# Patient Record
Sex: Male | Born: 1935 | Race: White | Hispanic: No | Smoking: Former smoker
Health system: Southern US, Community
[De-identification: ages and names within clinical notes are randomized; demographics above are authoritative.]

## PROBLEM LIST (undated history)

## (undated) DIAGNOSIS — H9190 Unspecified hearing loss, unspecified ear: Secondary | ICD-10-CM

## (undated) DIAGNOSIS — M109 Gout, unspecified: Secondary | ICD-10-CM

## (undated) DIAGNOSIS — N200 Calculus of kidney: Secondary | ICD-10-CM

## (undated) DIAGNOSIS — I10 Essential (primary) hypertension: Secondary | ICD-10-CM

## (undated) HISTORY — DX: Unspecified hearing loss, unspecified ear: H91.90

## (undated) HISTORY — DX: Gout, unspecified: M10.9

## (undated) HISTORY — DX: Calculus of kidney: N20.0

## (undated) HISTORY — PX: CHOLECYSTECTOMY: SHX55

## (undated) HISTORY — DX: Essential (primary) hypertension: I10

## (undated) HISTORY — PX: HERNIA REPAIR: SHX51

---

## 2014-03-06 DIAGNOSIS — L0291 Cutaneous abscess, unspecified: Secondary | ICD-10-CM | POA: Diagnosis not present

## 2014-03-22 DIAGNOSIS — Z9842 Cataract extraction status, left eye: Secondary | ICD-10-CM | POA: Diagnosis not present

## 2014-03-22 DIAGNOSIS — H43813 Vitreous degeneration, bilateral: Secondary | ICD-10-CM | POA: Diagnosis not present

## 2014-05-30 DIAGNOSIS — J0100 Acute maxillary sinusitis, unspecified: Secondary | ICD-10-CM | POA: Diagnosis not present

## 2014-05-30 DIAGNOSIS — J209 Acute bronchitis, unspecified: Secondary | ICD-10-CM | POA: Diagnosis not present

## 2014-05-30 DIAGNOSIS — J309 Allergic rhinitis, unspecified: Secondary | ICD-10-CM | POA: Diagnosis not present

## 2014-10-01 DIAGNOSIS — Z23 Encounter for immunization: Secondary | ICD-10-CM | POA: Diagnosis not present

## 2016-07-15 DIAGNOSIS — R972 Elevated prostate specific antigen [PSA]: Secondary | ICD-10-CM | POA: Diagnosis not present

## 2016-07-15 DIAGNOSIS — N401 Enlarged prostate with lower urinary tract symptoms: Secondary | ICD-10-CM | POA: Diagnosis not present

## 2016-09-30 DIAGNOSIS — Z23 Encounter for immunization: Secondary | ICD-10-CM | POA: Diagnosis not present

## 2016-12-07 DIAGNOSIS — M25511 Pain in right shoulder: Secondary | ICD-10-CM | POA: Diagnosis not present

## 2016-12-30 DIAGNOSIS — J180 Bronchopneumonia, unspecified organism: Secondary | ICD-10-CM | POA: Diagnosis not present

## 2017-02-08 DIAGNOSIS — N401 Enlarged prostate with lower urinary tract symptoms: Secondary | ICD-10-CM | POA: Diagnosis not present

## 2017-02-08 DIAGNOSIS — R972 Elevated prostate specific antigen [PSA]: Secondary | ICD-10-CM | POA: Diagnosis not present

## 2017-08-06 DIAGNOSIS — M25511 Pain in right shoulder: Secondary | ICD-10-CM | POA: Diagnosis not present

## 2017-08-06 DIAGNOSIS — N3000 Acute cystitis without hematuria: Secondary | ICD-10-CM | POA: Diagnosis not present

## 2017-08-06 DIAGNOSIS — N3001 Acute cystitis with hematuria: Secondary | ICD-10-CM | POA: Diagnosis not present

## 2017-08-09 DIAGNOSIS — N3020 Other chronic cystitis without hematuria: Secondary | ICD-10-CM | POA: Diagnosis not present

## 2017-08-09 DIAGNOSIS — R972 Elevated prostate specific antigen [PSA]: Secondary | ICD-10-CM | POA: Diagnosis not present

## 2017-08-09 DIAGNOSIS — N401 Enlarged prostate with lower urinary tract symptoms: Secondary | ICD-10-CM | POA: Diagnosis not present

## 2017-08-10 DIAGNOSIS — B9689 Other specified bacterial agents as the cause of diseases classified elsewhere: Secondary | ICD-10-CM | POA: Diagnosis not present

## 2017-08-10 DIAGNOSIS — M109 Gout, unspecified: Secondary | ICD-10-CM | POA: Diagnosis not present

## 2017-08-10 DIAGNOSIS — N3000 Acute cystitis without hematuria: Secondary | ICD-10-CM | POA: Diagnosis not present

## 2017-08-10 DIAGNOSIS — M1990 Unspecified osteoarthritis, unspecified site: Secondary | ICD-10-CM | POA: Diagnosis not present

## 2017-08-10 DIAGNOSIS — R0602 Shortness of breath: Secondary | ICD-10-CM | POA: Diagnosis not present

## 2017-09-10 DIAGNOSIS — Z23 Encounter for immunization: Secondary | ICD-10-CM | POA: Diagnosis not present

## 2017-10-18 DIAGNOSIS — H52221 Regular astigmatism, right eye: Secondary | ICD-10-CM | POA: Diagnosis not present

## 2017-10-18 DIAGNOSIS — I10 Essential (primary) hypertension: Secondary | ICD-10-CM | POA: Diagnosis not present

## 2017-12-06 DIAGNOSIS — Z6823 Body mass index (BMI) 23.0-23.9, adult: Secondary | ICD-10-CM | POA: Diagnosis not present

## 2017-12-06 DIAGNOSIS — M19011 Primary osteoarthritis, right shoulder: Secondary | ICD-10-CM | POA: Diagnosis not present

## 2017-12-06 DIAGNOSIS — M109 Gout, unspecified: Secondary | ICD-10-CM | POA: Diagnosis not present

## 2017-12-06 DIAGNOSIS — I10 Essential (primary) hypertension: Secondary | ICD-10-CM | POA: Diagnosis not present

## 2018-02-07 DIAGNOSIS — N401 Enlarged prostate with lower urinary tract symptoms: Secondary | ICD-10-CM | POA: Diagnosis not present

## 2018-02-07 DIAGNOSIS — R972 Elevated prostate specific antigen [PSA]: Secondary | ICD-10-CM | POA: Diagnosis not present

## 2018-02-28 DIAGNOSIS — I10 Essential (primary) hypertension: Secondary | ICD-10-CM | POA: Diagnosis not present

## 2018-02-28 DIAGNOSIS — M1000 Idiopathic gout, unspecified site: Secondary | ICD-10-CM | POA: Diagnosis not present

## 2018-02-28 DIAGNOSIS — J018 Other acute sinusitis: Secondary | ICD-10-CM | POA: Diagnosis not present

## 2018-02-28 DIAGNOSIS — R7303 Prediabetes: Secondary | ICD-10-CM | POA: Diagnosis not present

## 2018-03-08 DIAGNOSIS — I10 Essential (primary) hypertension: Secondary | ICD-10-CM | POA: Diagnosis not present

## 2018-03-08 DIAGNOSIS — E782 Mixed hyperlipidemia: Secondary | ICD-10-CM | POA: Diagnosis not present

## 2018-05-05 DIAGNOSIS — M1000 Idiopathic gout, unspecified site: Secondary | ICD-10-CM | POA: Diagnosis not present

## 2018-05-05 DIAGNOSIS — I10 Essential (primary) hypertension: Secondary | ICD-10-CM | POA: Diagnosis not present

## 2018-05-05 DIAGNOSIS — Z1159 Encounter for screening for other viral diseases: Secondary | ICD-10-CM | POA: Diagnosis not present

## 2018-05-05 DIAGNOSIS — Z0000 Encounter for general adult medical examination without abnormal findings: Secondary | ICD-10-CM | POA: Diagnosis not present

## 2018-06-16 DIAGNOSIS — Z9841 Cataract extraction status, right eye: Secondary | ICD-10-CM | POA: Diagnosis not present

## 2018-06-16 DIAGNOSIS — Z9842 Cataract extraction status, left eye: Secondary | ICD-10-CM | POA: Diagnosis not present

## 2018-06-16 DIAGNOSIS — H353131 Nonexudative age-related macular degeneration, bilateral, early dry stage: Secondary | ICD-10-CM | POA: Diagnosis not present

## 2018-06-16 DIAGNOSIS — H524 Presbyopia: Secondary | ICD-10-CM | POA: Diagnosis not present

## 2018-06-16 DIAGNOSIS — Z961 Presence of intraocular lens: Secondary | ICD-10-CM | POA: Diagnosis not present

## 2018-08-29 DIAGNOSIS — Z23 Encounter for immunization: Secondary | ICD-10-CM | POA: Diagnosis not present

## 2018-08-30 DIAGNOSIS — N401 Enlarged prostate with lower urinary tract symptoms: Secondary | ICD-10-CM | POA: Diagnosis not present

## 2018-08-30 DIAGNOSIS — R972 Elevated prostate specific antigen [PSA]: Secondary | ICD-10-CM | POA: Diagnosis not present

## 2018-11-04 DIAGNOSIS — E782 Mixed hyperlipidemia: Secondary | ICD-10-CM | POA: Diagnosis not present

## 2018-11-04 DIAGNOSIS — I10 Essential (primary) hypertension: Secondary | ICD-10-CM | POA: Diagnosis not present

## 2018-11-04 DIAGNOSIS — M1000 Idiopathic gout, unspecified site: Secondary | ICD-10-CM | POA: Diagnosis not present

## 2018-11-07 DIAGNOSIS — R7301 Impaired fasting glucose: Secondary | ICD-10-CM | POA: Diagnosis not present

## 2018-11-28 DIAGNOSIS — R944 Abnormal results of kidney function studies: Secondary | ICD-10-CM | POA: Diagnosis not present

## 2019-02-20 ENCOUNTER — Other Ambulatory Visit: Payer: Self-pay | Admitting: Family Medicine

## 2019-04-13 ENCOUNTER — Other Ambulatory Visit: Payer: Self-pay | Admitting: Family Medicine

## 2019-04-13 ENCOUNTER — Other Ambulatory Visit: Payer: Self-pay

## 2019-04-13 MED ORDER — FINASTERIDE 5 MG PO TABS: 5.0000 mg | ORAL_TABLET | Freq: Every day | ORAL | 1 refills | Status: DC

## 2019-04-17 ENCOUNTER — Other Ambulatory Visit: Payer: Self-pay

## 2019-04-17 MED ORDER — FINASTERIDE 5 MG PO TABS: 5.0000 mg | ORAL_TABLET | Freq: Every day | ORAL | 1 refills | Status: DC

## 2019-04-21 ENCOUNTER — Other Ambulatory Visit: Payer: Self-pay | Admitting: Family Medicine

## 2019-04-21 MED ORDER — FINASTERIDE 5 MG PO TABS: 5.0000 mg | ORAL_TABLET | Freq: Every day | ORAL | 1 refills | Status: AC

## 2019-05-09 ENCOUNTER — Ambulatory Visit (INDEPENDENT_AMBULATORY_CARE_PROVIDER_SITE_OTHER): Payer: Medicare HMO

## 2019-05-09 ENCOUNTER — Other Ambulatory Visit: Payer: Self-pay

## 2019-05-09 VITALS — BP 120/74 | HR 60 | Temp 97.9°F | Ht 66.0 in | Wt 174.0 lb

## 2019-05-09 DIAGNOSIS — Z0000 Encounter for general adult medical examination without abnormal findings: Secondary | ICD-10-CM | POA: Diagnosis not present

## 2019-05-09 NOTE — Progress Notes (Signed)
Subjective:   Ryan Garrison is a 86 y.o. male who presents for Medicare Annual/Subsequent preventive examination.  This wellness visit is conducted by a nurse.  The patient's medications were reviewed and reconciled since the patient's last visit.  History details were provided by the patient.  The history appears to be reliable.    Patient's last AWV was one year ago.   Medical History: Patient history and Family history was reviewed  Medications, Allergies, and preventative health maintenance was reviewed and updated.   Review of Systems:  Review of Systems  Constitutional: Negative.   HENT: Positive for hearing loss.   Eyes: Negative.   Respiratory: Negative.  Negative for cough, chest tightness and shortness of breath.   Cardiovascular: Negative.  Negative for chest pain and palpitations.  Gastrointestinal: Negative.   Endocrine: Negative.   Genitourinary: Negative.   Musculoskeletal: Positive for arthralgias. Negative for joint swelling and myalgias.       Right Shoulder Pain  Neurological: Negative.  Negative for dizziness, numbness and headaches.  Psychiatric/Behavioral: Negative.  Negative for agitation, confusion and suicidal ideas. The patient is not nervous/anxious.    Cardiac Risk Factors include: advanced age (>51men, >72 women);hypertension;male gender     Objective:    Vitals: BP 120/74 (BP Location: Left Arm, Patient Position: Sitting, Cuff Size: Large)   Pulse 60   Temp 97.9 F (36.6 C) (Temporal)   Ht 5\' 6"  (1.676 m)   Wt 174 lb (78.9 kg)   SpO2 96%   BMI 28.08 kg/m   Body mass index is 28.08 kg/m.  Advanced Directives 05/09/2019  Does Patient Have a Medical Advance Directive? No  Would patient like information on creating a medical advance directive? Yes (MAU/Ambulatory/Procedural Areas - Information given)    Tobacco Social History   Tobacco Use  Smoking Status Former Smoker   Types: Cigarettes   Quit date: 1954   Years since quitting: 67.3   Smokeless Tobacco Never Used     Counseling given: Not Answered   Clinical Intake:  Pre-visit preparation completed: Yes  Pain : 0-10 Pain Score: 3  Pain Type: Acute pain Pain Location: Shoulder Pain Orientation: Right Pain Descriptors / Indicators: Aching Pain Frequency: Intermittent     BMI - recorded: 28 Nutritional Status: BMI 25 -29 Overweight Nutritional Risks: None Diabetes: No  How often do you need to have someone help you when you read instructions, pamphlets, or other written materials from your doctor or pharmacy?: 2 - Rarely  Interpreter Needed?: No     Past Medical History:  Diagnosis Date   Gout    Hearing loss    Hypertension    Renal stones     Family History  Problem Relation Age of Onset   Leukemia Father    Social History   Socioeconomic History   Marital status: Married    Spouse name: Gracie   Number of children: 3   Years of education: Not on file   Highest education level: Not on file  Occupational History   Occupation: Retired  Tobacco Use   Smoking status: Former Smoker    Types: Cigarettes    Quit date: 1954    Years since quitting: 67.3   Smokeless tobacco: Never Used  Substance and Sexual Activity   Alcohol use: Never   Drug use: Never   Sexual activity: Not on file  Other Topics Concern   Not on file  Social History Narrative   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   4  Brothers, 1 deceased   36 Sisters, 2 deceased   Social Determinants of Radio broadcast assistant Strain:    Difficulty of Paying Living Expenses:   Food Insecurity:    Worried About Charity fundraiser in the Last Year:    Arboriculturist in the Last Year:   Transportation Needs:    Film/video editor (Medical):    Lack of Transportation (Non-Medical):   Physical Activity:    Days of Exercise per Week:    Minutes of Exercise per Session:   Stress:    Feeling of Stress :   Social Connections:    Frequency of Communication with Friends and Family:     Frequency of Social Gatherings with Friends and Family:    Attends Religious Services:    Active Member of Clubs or Organizations:    Attends Archivist Meetings:    Marital Status:     Outpatient Encounter Medications as of 05/09/2019  Medication Sig   allopurinol (ZYLOPRIM) 300 MG tablet Take 300 mg by mouth daily.   aspirin 81 MG chewable tablet Chew 81 mg by mouth daily.   atenolol-chlorthalidone (TENORETIC) 50-25 MG tablet TAKE 1/2 TABLET EVERY DAY   finasteride (PROSCAR) 5 MG tablet Take 1 tablet (5 mg total) by mouth daily.   potassium chloride SA (KLOR-CON) 20 MEQ tablet TAKE 3 TABLETS DAILY ON MONDAYS, WEDNESDAYS, AND FRIDAYS - TAKE 2 TABLETS ON ALL OTHER DAYS.   No facility-administered encounter medications on file as of 05/09/2019.    Activities of Daily Living In your present state of health, do you have any difficulty performing the following activities: 05/09/2019  Hearing? Y  Vision? N  Difficulty concentrating or making decisions? N  Walking or climbing stairs? N  Dressing or bathing? N  Doing errands, shopping? N  Preparing Food and eating ? N  Using the Toilet? N  In the past six months, have you accidently leaked urine? N  Do you have problems with loss of bowel control? N  Managing your Medications? N  Managing your Finances? N  Housekeeping or managing your Housekeeping? N  Some recent data might be hidden    Patient Care Team: Rochel Brome, MD as PCP - General (Family Medicine)   Assessment:   This is a routine wellness examination for [place].  Exercise Activities and Dietary recommendations Current Exercise Habits: Home exercise routine, Type of exercise: walking, Time (Minutes): 30, Frequency (Times/Week): 3, Weekly Exercise (Minutes/Week): 90, Intensity: Mild  Goals      Advanced Care Planning complete by Next Appointment     Prevent falls        Fall Risk Fall Risk  05/09/2019  Falls in the past year? 1  Number falls in past yr: 0   Injury with Fall? 0  Risk for fall due to : No Fall Risks  Follow up Falls prevention discussed   Is the patient's home free of loose throw rugs in walkways, pet beds, electrical cords, etc?   yes      Grab bars in the bathroom? yes      Handrails on the stairs?   yes      Adequate lighting?   yes   Depression Screen PHQ 2/9 Scores 05/09/2019  PHQ - 2 Score 0    Cognitive Function     6CIT Screen 05/09/2019  What Year? 0 points  What month? 0 points  What time? 0 points  Count back from 20 0 points  Months in reverse 2 points  Repeat phrase 0 points  Total Score 2    Immunization History  Administered Date(s) Administered   Influenza-Unspecified 08/06/2018   Moderna SARS-COVID-2 Vaccination 03/01/2019, 03/22/2019   Pneumococcal Conjugate-13 02/08/2014   Pneumococcal Polysaccharide-23 02/06/2013   Tdap 01/06/2011     Screening Tests Health Maintenance  Topic Date Due   INFLUENZA VACCINE  08/06/2019   TETANUS/TDAP  01/05/2021   COVID-19 Vaccine  Completed   PNA vac Low Risk Adult  Completed   Cancer Screenings: Lung: Low Dose CT Chest recommended if Age 66-80 years, 30 pack-year currently smoking OR have quit w/in 15years. Patient does not qualify. Colorectal: Done in 2004 - Normal Results        Plan:    Counseling was provided today regarding the following topics: healthy eating habits, home safety, vitamin suppliments, regular exercise, tobacco avoidance, stress reduction, use of seat belts, firearm safety, and fall prevention.  Annual recommendations include: influenza vaccine, dental cleanings, and eye exams.  I am getting together resources for caregiver assistance to be mailed to him.  I have personally reviewed and noted the following in the patient's chart:   Medical and social history Use of alcohol, tobacco or illicit drugs  Current medications and supplements Functional ability and status Nutritional status Physical activity Advanced  directives List of other physicians Hospitalizations, surgeries, and ER visits in previous 12 months Vitals Screenings to include cognitive, depression, and falls Referrals and appointments  In addition, I have reviewed and discussed with patient certain preventive protocols, quality metrics, and best practice recommendations. A written personalized care plan for preventive services as well as general preventive health recommendations were provided to patient.     Erie Noe, LPN  [phone]

## 2019-05-09 NOTE — Patient Instructions (Signed)
 Fall Prevention in the Home, Adult Falls can cause injuries. They can happen to people of all ages. There are many things you can do to make your home safe and to help prevent falls. Ask for help when making these changes, if needed. What actions can I take to prevent falls? General Instructions  Use good lighting in all rooms. Replace any light bulbs that burn out.  Turn on the lights when you go into a dark area. Use night-lights.  Keep items that you use often in easy-to-reach places. Lower the shelves around your home if necessary.  Set up your furniture so you have a clear path. Avoid moving your furniture around.  Do not have throw rugs and other things on the floor that can make you trip.  Avoid walking on wet floors.  If any of your floors are uneven, fix them.  Add color or contrast paint or tape to clearly mark and help you see: ? Any grab bars or handrails. ? First and last steps of stairways. ? Where the edge of each step is.  If you use a stepladder: ? Make sure that it is fully opened. Do not climb a closed stepladder. ? Make sure that both sides of the stepladder are locked into place. ? Ask someone to hold the stepladder for you while you use it.  If there are any pets around you, be aware of where they are. What can I do in the bathroom?      Keep the floor dry. Clean up any water that spills onto the floor as soon as it happens.  Remove soap buildup in the tub or shower regularly.  Use non-skid mats or decals on the floor of the tub or shower.  Attach bath mats securely with double-sided, non-slip rug tape.  If you need to sit down in the shower, use a plastic, non-slip stool.  Install grab bars by the toilet and in the tub and shower. Do not use towel bars as grab bars. What can I do in the bedroom?  Make sure that you have a light by your bed that is easy to reach.  Do not use any sheets or blankets that are too big for your bed. They should  not hang down onto the floor.  Have a firm chair that has side arms. You can use this for support while you get dressed. What can I do in the kitchen?  Clean up any spills right away.  If you need to reach something above you, use a strong step stool that has a grab bar.  Keep electrical cords out of the way.  Do not use floor polish or wax that makes floors slippery. If you must use wax, use non-skid floor wax. What can I do with my stairs?  Do not leave any items on the stairs.  Make sure that you have a light switch at the top of the stairs and the bottom of the stairs. If you do not have them, ask someone to add them for you.  Make sure that there are handrails on both sides of the stairs, and use them. Fix handrails that are broken or loose. Make sure that handrails are as long as the stairways.  Install non-slip stair treads on all stairs in your home.  Avoid having throw rugs at the top or bottom of the stairs. If you do have throw rugs, attach them to the floor with carpet tape.  Choose a carpet that   does not hide the edge of the steps on the stairway.  Check any carpeting to make sure that it is firmly attached to the stairs. Fix any carpet that is loose or worn. What can I do on the outside of my home?  Use bright outdoor lighting.  Regularly fix the edges of walkways and driveways and fix any cracks.  Remove anything that might make you trip as you walk through a door, such as a raised step or threshold.  Trim any bushes or trees on the path to your home.  Regularly check to see if handrails are loose or broken. Make sure that both sides of any steps have handrails.  Install guardrails along the edges of any raised decks and porches.  Clear walking paths of anything that might make someone trip, such as tools or rocks.  Have any leaves, snow, or ice cleared regularly.  Use sand or salt on walking paths during winter.  Clean up any spills in your garage right  away. This includes grease or oil spills. What other actions can I take?  Wear shoes that: ? Have a low heel. Do not wear high heels. ? Have rubber bottoms. ? Are comfortable and fit you well. ? Are closed at the toe. Do not wear open-toe sandals.  Use tools that help you move around (mobility aids) if they are needed. These include: ? Canes. ? Walkers. ? Scooters. ? Crutches.  Review your medicines with your doctor. Some medicines can make you feel dizzy. This can increase your chance of falling. Ask your doctor what other things you can do to help prevent falls. Where to find more information  Centers for Disease Control and Prevention, STEADI: https://cdc.gov  National Institute on Aging: https://go4life.nia.nih.gov Contact a doctor if:  You are afraid of falling at home.  You feel weak, drowsy, or dizzy at home.  You fall at home. Summary  There are many simple things that you can do to make your home safe and to help prevent falls.  Ways to make your home safe include removing tripping hazards and installing grab bars in the bathroom.  Ask for help when making these changes in your home. This information is not intended to replace advice given to you by your health care provider. Make sure you discuss any questions you have with your health care provider. Document Revised: 04/14/2018 Document Reviewed: 08/06/2016 Elsevier Patient Education  2020 Elsevier Inc.   Health Maintenance, Male Adopting a healthy lifestyle and getting preventive care are important in promoting health and wellness. Ask your health care provider about:  The right schedule for you to have regular tests and exams.  Things you can do on your own to prevent diseases and keep yourself healthy. What should I know about diet, weight, and exercise? Eat a healthy diet   Eat a diet that includes plenty of vegetables, fruits, low-fat dairy products, and lean protein.  Do not eat a lot of foods  that are high in solid fats, added sugars, or sodium. Maintain a healthy weight Body mass index (BMI) is a measurement that can be used to identify possible weight problems. It estimates body fat based on height and weight. Your health care provider can help determine your BMI and help you achieve or maintain a healthy weight. Get regular exercise Get regular exercise. This is one of the most important things you can do for your health. Most adults should:  Exercise for at least 150 minutes each week. The   exercise should increase your heart rate and make you sweat (moderate-intensity exercise).  Do strengthening exercises at least twice a week. This is in addition to the moderate-intensity exercise.  Spend less time sitting. Even light physical activity can be beneficial. Watch cholesterol and blood lipids Have your blood tested for lipids and cholesterol at 84 years of age, then have this test every 5 years. You may need to have your cholesterol levels checked more often if:  Your lipid or cholesterol levels are high.  You are older than 84 years of age.  You are at high risk for heart disease. What should I know about cancer screening? Many types of cancers can be detected early and may often be prevented. Depending on your health history and family history, you may need to have cancer screening at various ages. This may include screening for:  Colorectal cancer.  Prostate cancer.  Skin cancer.  Lung cancer. What should I know about heart disease, diabetes, and high blood pressure? Blood pressure and heart disease  High blood pressure causes heart disease and increases the risk of stroke. This is more likely to develop in people who have high blood pressure readings, are of African descent, or are overweight.  Talk with your health care provider about your target blood pressure readings.  Have your blood pressure checked: ? Every 3-5 years if you are 18-39 years of  age. ? Every year if you are 40 years old or older.  If you are between the ages of 65 and 75 and are a current or former smoker, ask your health care provider if you should have a one-time screening for abdominal aortic aneurysm (AAA). Diabetes Have regular diabetes screenings. This checks your fasting blood sugar level. Have the screening done:  Once every three years after age 45 if you are at a normal weight and have a low risk for diabetes.  More often and at a younger age if you are overweight or have a high risk for diabetes. What should I know about preventing infection? Hepatitis B If you have a higher risk for hepatitis B, you should be screened for this virus. Talk with your health care provider to find out if you are at risk for hepatitis B infection. Hepatitis C Blood testing is recommended for:  Everyone born from 1945 through 1965.  Anyone with known risk factors for hepatitis C. Sexually transmitted infections (STIs)  You should be screened each year for STIs, including gonorrhea and chlamydia, if: ? You are sexually active and are younger than 84 years of age. ? You are older than 84 years of age and your health care provider tells you that you are at risk for this type of infection. ? Your sexual activity has changed since you were last screened, and you are at increased risk for chlamydia or gonorrhea. Ask your health care provider if you are at risk.  Ask your health care provider about whether you are at high risk for HIV. Your health care provider may recommend a prescription medicine to help prevent HIV infection. If you choose to take medicine to prevent HIV, you should first get tested for HIV. You should then be tested every 3 months for as long as you are taking the medicine. Follow these instructions at home: Lifestyle  Do not use any products that contain nicotine or tobacco, such as cigarettes, e-cigarettes, and chewing tobacco. If you need help quitting,  ask your health care provider.  Do not use street   drugs.  Do not share needles.  Ask your health care provider for help if you need support or information about quitting drugs. Alcohol use  Do not drink alcohol if your health care provider tells you not to drink.  If you drink alcohol: ? Limit how much you have to 0-2 drinks a day. ? Be aware of how much alcohol is in your drink. In the [place] one drink equals one 12 oz bottle of beer (355 mL), one 5 oz glass of wine (148 mL), or one 1 oz glass of hard liquor (44 mL). General instructions  Schedule regular health, dental, and eye exams.  Stay current with your vaccines.  Tell your health care provider if: ? You often feel depressed. ? You have ever been abused or do not feel safe at home. Summary  Adopting a healthy lifestyle and getting preventive care are important in promoting health and wellness.  Follow your health care provider's instructions about healthy diet, exercising, and getting tested or screened for diseases.  Follow your health care provider's instructions on monitoring your cholesterol and blood pressure. This information is not intended to replace advice given to you by your health care provider. Make sure you discuss any questions you have with your health care provider. Document Revised: 12/15/2017 Document Reviewed: 12/15/2017 Elsevier Patient Education  2020 Elsevier Inc.  

## 2019-05-12 DIAGNOSIS — N3090 Cystitis, unspecified without hematuria: Secondary | ICD-10-CM | POA: Diagnosis not present

## 2019-05-12 DIAGNOSIS — N3001 Acute cystitis with hematuria: Secondary | ICD-10-CM | POA: Diagnosis not present

## 2019-05-15 ENCOUNTER — Ambulatory Visit (INDEPENDENT_AMBULATORY_CARE_PROVIDER_SITE_OTHER): Payer: Medicare HMO | Admitting: Physician Assistant

## 2019-05-15 ENCOUNTER — Other Ambulatory Visit: Payer: Self-pay

## 2019-05-15 ENCOUNTER — Encounter: Payer: Self-pay | Admitting: Physician Assistant

## 2019-05-15 VITALS — BP 140/82 | HR 64 | Temp 96.6°F | Resp 16 | Wt 171.0 lb

## 2019-05-15 DIAGNOSIS — J060 Acute laryngopharyngitis: Secondary | ICD-10-CM

## 2019-05-15 DIAGNOSIS — R05 Cough: Secondary | ICD-10-CM | POA: Diagnosis not present

## 2019-05-15 DIAGNOSIS — N3000 Acute cystitis without hematuria: Secondary | ICD-10-CM | POA: Insufficient documentation

## 2019-05-15 LAB — POCT URINALYSIS DIPSTICK
Bilirubin, UA: NEGATIVE
Blood, UA: NEGATIVE
Glucose, UA: NEGATIVE
Ketones, UA: NEGATIVE
Nitrite, UA: NEGATIVE
Protein, UA: NEGATIVE
Spec Grav, UA: 1.015 (ref 1.010–1.025)
Urobilinogen, UA: 0.2 E.U./dL
pH, UA: 7 (ref 5.0–8.0)

## 2019-05-15 LAB — POC COVID19 BINAXNOW: SARS Coronavirus 2 Ag: NEGATIVE

## 2019-05-15 NOTE — Assessment & Plan Note (Signed)
otc decongestants as needed Continue cipro bid

## 2019-05-15 NOTE — Progress Notes (Signed)
Acute Office Visit  Subjective:    Patient ID: Ryan Garrison, male    DOB: 1930-03-20, 86 y.o.   MRN: [id number]  Chief Complaint  Patient presents with  . Urinary Tract Infection    HPI Patient is in today for follow up of UTI - he states that over the weekend he was running a fever and ended up going to Urgent Care There they checked ua and told he had UTI (although he had only urinary hesitancy) and given Cipro 500mg  bid for a week - states he is feeling some better  Pt has had a mild cough and congestion as well - urgent care did not check COVID test on him despite his symptoms and the fact his temp elevated to 102 degrees  Past Medical History:  Diagnosis Date  . Gout   . Hearing loss   . Hypertension   . Renal stones     Past Surgical History:  Procedure Laterality Date  . CHOLECYSTECTOMY    . HERNIA REPAIR Right    Inguinal    Family History  Problem Relation Age of Onset  . Leukemia Father     Social History   Socioeconomic History  . Marital status: Married    Spouse name: Terri Piedra  . Number of children: 3  . Years of education: Not on file  . Highest education level: Not on file  Occupational History  . Occupation: Retired  Tobacco Use  . Smoking status: Former Smoker    Types: Cigarettes    Quit date: 1954    Years since quitting: 67.4  . Smokeless tobacco: Never Used  Substance and Sexual Activity  . Alcohol use: Never  . Drug use: Never  . Sexual activity: Not on file  Other Topics Concern  . 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 Difficulty of Paying Living Expenses:   Food Insecurity:   . Worried About Charity fundraiser in the Last Year:   . Arboriculturist in the Last Year:   Transportation Needs:   . Film/video editor (Medical):   Marland Kitchen Lack of Transportation (Non-Medical):   Physical  Activity:   . Days of Exercise per Week:   . Minutes of Exercise per Session:   Stress:   . Feeling of Stress :   Social Connections:   . Frequency of Communication with Friends and Family:   . Frequency of Social Gatherings with Friends and Family:   . Attends Religious Services:   . Active Member of Clubs or Organizations:   . Attends Archivist Meetings:   Marland Kitchen Marital Status:   Intimate Partner Violence:   . Fear of Current or Ex-Partner:   . Emotionally Abused:   Marland Kitchen Physically Abused:   . Sexually Abused:      Current Outpatient Medications:  .  allopurinol (ZYLOPRIM) 300 MG tablet, Take 300 mg by mouth daily., Disp: , Rfl:  .  aspirin 81 MG chewable tablet, Chew 81 mg by mouth daily., Disp: , Rfl:  .  atenolol-chlorthalidone (TENORETIC) 50-25 MG tablet, TAKE 1/2 TABLET EVERY DAY, Disp: 45 tablet, Rfl: 3 .  ciprofloxacin (CIPRO) 500 MG tablet, , Disp: , Rfl:  .  finasteride (PROSCAR) 5 MG tablet, Take 1 tablet (5 mg total) by mouth daily., Disp: 90  tablet, Rfl: 1 .  potassium chloride SA (KLOR-CON) 20 MEQ tablet, TAKE 3 TABLETS DAILY ON MONDAYS, WEDNESDAYS, AND FRIDAYS - TAKE 2 TABLETS ON ALL OTHER DAYS., Disp: 221 tablet, Rfl: 2   No Known Allergies  CONSTITUTIONAL:see HPI E/N/T: see HPI CARDIOVASCULAR: Negative for chest pain, dizziness, palpitations and pedal edema.  RESPIRATORY: Negative for recent cough and dyspnea.  GASTROINTESTINAL: Negative for abdominal pain, acid reflux symptoms, constipation, diarrhea, nausea and vomiting.    PSYCHIATRIC: Negative for sleep disturbance and to question depression screen.  Negative for depression, negative for anhedonia.         Objective:    PHYSICAL EXAM:   VS: BP 140/82   Pulse 64   Temp (!) 96.6 F (35.9 C)   Resp 16   Wt 171 lb (77.6 kg)   SpO2 98%   BMI 27.60 kg/m   GEN: Well nourished, well developed, in no acute distress  HEENT: normal external ears and nose - normal external auditory canals and TMS -  hearing grossly normal -  - Lips, Teeth and Gums - normal  Oropharynx - normal mucosa, palate, and posterior pharynx  Cardiac: RRR; no murmurs, rubs, or gallops,no edema - no significant varicosities Respiratory:  normal respiratory rate and pattern with no distress - normal breath sounds with no rales, rhonchi, wheezes or rubs  Psych: euthymic mood, appropriate affect and demeanor Office Visit on 05/15/2019  Component Date Value Ref Range Status  . SARS Coronavirus 2 Ag 05/15/2019 Negative  Negative Final  . Color, UA 05/15/2019 yellow   Final  . Clarity, UA 05/15/2019 clear   Final  . Glucose, UA 05/15/2019 Negative  Negative Final  . Bilirubin, UA 05/15/2019 neg   Final  . Ketones, UA 05/15/2019 neg   Final  . Spec Grav, UA 05/15/2019 1.015  1.010 - 1.025 Final  . Blood, UA 05/15/2019 neg   Final  . pH, UA 05/15/2019 7.0  5.0 - 8.0 Final  . Protein, UA 05/15/2019 Negative  Negative Final  . Urobilinogen, UA 05/15/2019 0.2  0.2 or 1.0 E.U./dL Final  . Nitrite, UA 05/15/2019 neg   Final  . Leukocytes, UA 05/15/2019 Large (3+)* Negative Final  . Appearance 05/15/2019 clear   Final  . Odor 05/15/2019 none   Final     Wt Readings from Last 3 Encounters:  05/15/19 171 lb (77.6 kg)  05/09/19 174 lb (78.9 kg)    There are no preventive care reminders to display for this patient.  There are no preventive care reminders to display for this patient.        Assessment & Plan:   Problem List Items Addressed This Visit      Respiratory   Acute laryngopharyngitis - Primary    otc decongestants as needed Continue cipro bid      Relevant Orders   POC COVID-19 (Completed)     Genitourinary   Acute cystitis without hematuria    Urine culture pending Probably more of prostatitis --- pt has follow up with Dr Nila Nephew in few weeks      Relevant Orders   POCT urinalysis dipstick (Completed)       No orders of the defined types were placed in this encounter.    SARA R  Glema Takaki, PA-C

## 2019-05-15 NOTE — Assessment & Plan Note (Signed)
Urine culture pending Probably more of prostatitis --- pt has follow up with Dr Nila Nephew in few weeks

## 2019-05-17 ENCOUNTER — Other Ambulatory Visit: Payer: Self-pay | Admitting: Physician Assistant

## 2019-05-17 LAB — URINE CULTURE: Organism ID, Bacteria: NO GROWTH

## 2019-05-17 MED ORDER — CIPROFLOXACIN HCL 500 MG PO TABS: 500.0000 mg | ORAL_TABLET | Freq: Two times a day (BID) | ORAL | 0 refills | Status: DC

## 2019-06-01 DIAGNOSIS — R972 Elevated prostate specific antigen [PSA]: Secondary | ICD-10-CM | POA: Diagnosis not present

## 2019-06-01 DIAGNOSIS — N401 Enlarged prostate with lower urinary tract symptoms: Secondary | ICD-10-CM | POA: Diagnosis not present

## 2019-07-18 DIAGNOSIS — H5201 Hypermetropia, right eye: Secondary | ICD-10-CM | POA: Diagnosis not present

## 2019-07-18 DIAGNOSIS — H524 Presbyopia: Secondary | ICD-10-CM | POA: Diagnosis not present

## 2019-07-18 DIAGNOSIS — Z9842 Cataract extraction status, left eye: Secondary | ICD-10-CM | POA: Diagnosis not present

## 2019-07-18 DIAGNOSIS — Z961 Presence of intraocular lens: Secondary | ICD-10-CM | POA: Diagnosis not present

## 2019-07-18 DIAGNOSIS — H353131 Nonexudative age-related macular degeneration, bilateral, early dry stage: Secondary | ICD-10-CM | POA: Diagnosis not present

## 2019-07-18 DIAGNOSIS — Z9841 Cataract extraction status, right eye: Secondary | ICD-10-CM | POA: Diagnosis not present

## 2019-07-18 DIAGNOSIS — H52221 Regular astigmatism, right eye: Secondary | ICD-10-CM | POA: Diagnosis not present

## 2019-07-25 DIAGNOSIS — L821 Other seborrheic keratosis: Secondary | ICD-10-CM | POA: Diagnosis not present

## 2019-07-25 DIAGNOSIS — L570 Actinic keratosis: Secondary | ICD-10-CM | POA: Diagnosis not present

## 2019-07-25 DIAGNOSIS — L578 Other skin changes due to chronic exposure to nonionizing radiation: Secondary | ICD-10-CM | POA: Diagnosis not present

## 2019-07-25 DIAGNOSIS — C44319 Basal cell carcinoma of skin of other parts of face: Secondary | ICD-10-CM | POA: Diagnosis not present

## 2019-07-27 ENCOUNTER — Other Ambulatory Visit: Payer: Self-pay | Admitting: Family Medicine

## 2019-08-03 DIAGNOSIS — C44319 Basal cell carcinoma of skin of other parts of face: Secondary | ICD-10-CM | POA: Diagnosis not present

## 2019-08-18 ENCOUNTER — Other Ambulatory Visit: Payer: Self-pay

## 2019-08-18 ENCOUNTER — Ambulatory Visit (INDEPENDENT_AMBULATORY_CARE_PROVIDER_SITE_OTHER): Payer: Medicare HMO | Admitting: Family Medicine

## 2019-08-18 ENCOUNTER — Encounter: Payer: Self-pay | Admitting: Family Medicine

## 2019-08-18 VITALS — BP 130/70 | HR 56 | Temp 97.2°F | Resp 16 | Ht 71.0 in | Wt 175.0 lb

## 2019-08-18 DIAGNOSIS — I10 Essential (primary) hypertension: Secondary | ICD-10-CM

## 2019-08-18 DIAGNOSIS — N400 Enlarged prostate without lower urinary tract symptoms: Secondary | ICD-10-CM | POA: Diagnosis not present

## 2019-08-18 DIAGNOSIS — R202 Paresthesia of skin: Secondary | ICD-10-CM | POA: Diagnosis not present

## 2019-08-18 NOTE — Progress Notes (Signed)
Subjective:  Patient ID: Ryan Garrison, male    DOB: 01/26/1930  Age: 86 y.o. MRN: [id number]  Chief Complaint  Patient presents with  . Hypertension   HPI Pt presents for follow up of hypertension. He is currently on atenolol/chlorthalidone. The patient is tolerating the medication well without side effects. He maintains a healthy diet and regular exercise regimen.    Gout: on allopurinol.   BPH: Taing proscar.   Current Outpatient Medications on File Prior to Visit  Medication Sig Dispense Refill  . acetaminophen (TYLENOL) 500 MG tablet Take 500 mg by mouth 2 (two) times daily.    Marland Kitchen allopurinol (ZYLOPRIM) 300 MG tablet TAKE 1 TABLET EVERY DAY 90 tablet 0  . aspirin 81 MG chewable tablet Chew 81 mg by mouth daily.    Marland Kitchen atenolol-chlorthalidone (TENORETIC) 50-25 MG tablet TAKE 1/2 TABLET EVERY DAY 45 tablet 3  . finasteride (PROSCAR) 5 MG tablet Take 1 tablet (5 mg total) by mouth daily. 90 tablet 1  . potassium chloride SA (KLOR-CON) 20 MEQ tablet TAKE 3 TABLETS DAILY ON MONDAYS, WEDNESDAYS, AND FRIDAYS - TAKE 2 TABLETS ON ALL OTHER DAYS. (Patient taking differently: 40 mEq daily. ) 221 tablet 2   No current facility-administered medications on file prior to visit.   Past Medical History:  Diagnosis Date  . Gout   . Hearing loss   . Hypertension   . Renal stones    Past Surgical History:  Procedure Laterality Date  . CHOLECYSTECTOMY    . HERNIA REPAIR Right    Inguinal    Family History  Problem Relation Age of Onset  . Leukemia Father    Social History   Socioeconomic History  . Marital status: Married    Spouse name: Terri Piedra  . Number of children: 3  . Years of education: Not on file  . Highest education level: Not on file  Occupational History  . Occupation: Retired  Tobacco Use  . Smoking status: Former Smoker    Types: Cigarettes    Quit date: 1954    Years since quitting: 67.6  . Smokeless tobacco: Never Used  Vaping Use  . Vaping Use: Never used   Substance and Sexual Activity  . Alcohol use: Never  . Drug use: Never  . Sexual activity: Not on file  Other Topics Concern  . 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 Difficulty of Paying Living Expenses:   Food Insecurity:   . Worried About Charity fundraiser in the Last Year:   . Arboriculturist in the Last Year:   Transportation Needs:   . Film/video editor (Medical):   Marland Kitchen Lack of Transportation (Non-Medical):   Physical Activity:   . Days of Exercise per Week:   . Minutes of Exercise per Session:   Stress:   . Feeling of Stress :   Social Connections:   . Frequency of Communication with Friends and Family:   . Frequency of Social Gatherings with Friends and Family:   . Attends Religious Services:   . Active Member of Clubs or Organizations:   . Attends Archivist Meetings:   Marland Kitchen Marital Status:     Review of Systems  Constitutional: Negative for chills, fatigue and fever.  HENT: Negative for congestion, ear pain and sore  throat.   Respiratory: Negative for cough and shortness of breath.   Cardiovascular: Negative for chest pain.  Gastrointestinal: Negative for abdominal pain, constipation, diarrhea, nausea and vomiting.  Endocrine: Negative for polydipsia, polyphagia and polyuria.  Genitourinary: Negative for dysuria and frequency.  Musculoskeletal: Negative for arthralgias and myalgias.  Neurological: Positive for numbness (left great toe.). Negative for dizziness and headaches.  Psychiatric/Behavioral: Negative for dysphoric mood.       No dysphoria     Objective:  BP 130/70   Pulse (!) 56   Temp (!) 97.2 F (36.2 C)   Resp 16   Ht 5\' 11"  (1.803 m)   Wt 175 lb (79.4 kg)   BMI 24.41 kg/m   BP/Weight 08/18/2019 09/07/4095 03/09/3297  Systolic BP 242 683 419  Diastolic BP 70 82 74  Wt. (Lbs) 175 171 174   BMI 24.41 27.6 28.08    Physical Exam Vitals reviewed.  Constitutional:      Appearance: Normal appearance.  Neck:     Vascular: No carotid bruit.  Cardiovascular:     Rate and Rhythm: Normal rate and regular rhythm.     Pulses: Normal pulses.     Heart sounds: Normal heart sounds.  Pulmonary:     Effort: Pulmonary effort is normal.     Breath sounds: Normal breath sounds. No wheezing, rhonchi or rales.  Abdominal:     General: Bowel sounds are normal.     Palpations: Abdomen is soft.     Tenderness: There is no abdominal tenderness.  Neurological:     Mental Status: He is alert and oriented to person, place, and time.     Comments: Numbness of left great toe.  Psychiatric:        Mood and Affect: Mood normal.        Behavior: Behavior normal.      Lab Results  Component Value Date   WBC 6.8 08/18/2019   HGB 14.8 08/18/2019   HCT 43.0 08/18/2019   PLT 131 (L) 08/18/2019   GLUCOSE 108 (H) 08/18/2019   ALT 13 08/18/2019   AST 25 08/18/2019   NA 141 08/18/2019   K 4.6 08/18/2019   CL 102 08/18/2019   CREATININE 1.36 (H) 08/18/2019   BUN 17 08/18/2019   CO2 25 08/18/2019   TSH 3.920 08/18/2019      Assessment & Plan:  1. Paresthesias - CBC with Differential/Platelet - Comprehensive metabolic panel - TSH - Q22 and Folate Panel  2. Essential hypertension, benign The current medical regimen is effective;  continue present plan and medications.  3. Benign prostatic hyperplasia without lower urinary tract symptoms The current medical regimen is effective;  continue present plan and medications.  Orders Placed This Encounter  Procedures  . CBC with Differential/Platelet  . Comprehensive metabolic panel  . TSH  . B12 and Folate Panel     Follow-up: Return in about 6 months (around 02/18/2020).  An After Visit Summary was printed and given to the patient.  Rochel Brome [facility] [phone]

## 2019-08-19 LAB — COMPREHENSIVE METABOLIC PANEL
ALT: 13 IU/L (ref 0–44)
AST: 25 IU/L (ref 0–40)
Albumin/Globulin Ratio: 1.3 (ref 1.2–2.2)
Albumin: 4.4 g/dL (ref 3.6–4.6)
Alkaline Phosphatase: 57 IU/L (ref 48–121)
BUN/Creatinine Ratio: 13 (ref 10–24)
BUN: 17 mg/dL (ref 8–27)
Bilirubin Total: 0.6 mg/dL (ref 0.0–1.2)
CO2: 25 mmol/L (ref 20–29)
Calcium: 9.9 mg/dL (ref 8.6–10.2)
Chloride: 102 mmol/L (ref 96–106)
Creatinine, Ser: 1.36 mg/dL — ABNORMAL HIGH (ref 0.76–1.27)
GFR calc Af Amer: 54 mL/min/{1.73_m2} — ABNORMAL LOW (ref >59)
GFR calc non Af Amer: 46 mL/min/{1.73_m2} — ABNORMAL LOW (ref >59)
Globulin, Total: 3.3 g/dL (ref 1.5–4.5)
Glucose: 108 mg/dL — ABNORMAL HIGH (ref 65–99)
Potassium: 4.6 mmol/L (ref 3.5–5.2)
Sodium: 141 mmol/L (ref 134–144)
Total Protein: 7.7 g/dL (ref 6.0–8.5)

## 2019-08-19 LAB — CBC WITH DIFFERENTIAL/PLATELET
Basophils Absolute: 0.1 10*3/uL (ref 0.0–0.2)
Basos: 1 %
EOS (ABSOLUTE): 0.2 10*3/uL (ref 0.0–0.4)
Eos: 2 %
Hematocrit: 43 % (ref 37.5–51.0)
Hemoglobin: 14.8 g/dL (ref 13.0–17.7)
Immature Grans (Abs): 0 10*3/uL (ref 0.0–0.1)
Immature Granulocytes: 0 %
Lymphocytes Absolute: 1.8 10*3/uL (ref 0.7–3.1)
Lymphs: 26 %
MCH: 32.5 pg (ref 26.6–33.0)
MCHC: 34.4 g/dL (ref 31.5–35.7)
MCV: 94 fL (ref 79–97)
Monocytes Absolute: 0.4 10*3/uL (ref 0.1–0.9)
Monocytes: 5 %
Neutrophils Absolute: 4.4 10*3/uL (ref 1.4–7.0)
Neutrophils: 66 %
Platelets: 131 10*3/uL — ABNORMAL LOW (ref 150–450)
RBC: 4.56 x10E6/uL (ref 4.14–5.80)
RDW: 12.7 % (ref 11.6–15.4)
WBC: 6.8 10*3/uL (ref 3.4–10.8)

## 2019-08-19 LAB — B12 AND FOLATE PANEL
Folate: 14.6 ng/mL (ref >3.0)
Vitamin B-12: 433 pg/mL (ref 232–1245)

## 2019-08-19 LAB — TSH: TSH: 3.92 u[IU]/mL (ref 0.450–4.500)

## 2019-09-13 ENCOUNTER — Encounter: Payer: Self-pay | Admitting: Family Medicine

## 2019-09-13 ENCOUNTER — Ambulatory Visit (INDEPENDENT_AMBULATORY_CARE_PROVIDER_SITE_OTHER): Payer: Medicare HMO

## 2019-09-13 DIAGNOSIS — Z23 Encounter for immunization: Secondary | ICD-10-CM | POA: Diagnosis not present

## 2019-12-25 ENCOUNTER — Other Ambulatory Visit: Payer: Self-pay | Admitting: Family Medicine

## 2019-12-27 ENCOUNTER — Ambulatory Visit (INDEPENDENT_AMBULATORY_CARE_PROVIDER_SITE_OTHER): Payer: Medicare HMO | Admitting: Family Medicine

## 2019-12-27 ENCOUNTER — Other Ambulatory Visit: Payer: Self-pay | Admitting: Family Medicine

## 2019-12-27 ENCOUNTER — Encounter: Payer: Self-pay | Admitting: Family Medicine

## 2019-12-27 VITALS — BP 102/80 | HR 70 | Temp 97.6°F | Resp 16 | Ht 71.0 in | Wt 172.0 lb

## 2019-12-27 DIAGNOSIS — F411 Generalized anxiety disorder: Secondary | ICD-10-CM | POA: Diagnosis not present

## 2019-12-27 MED ORDER — LORAZEPAM 0.5 MG PO TABS: 0.5000 mg | ORAL_TABLET | Freq: Every day | ORAL | 1 refills | Status: DC | PRN

## 2019-12-27 MED ORDER — ESCITALOPRAM OXALATE 5 MG PO TABS: 5.0000 mg | ORAL_TABLET | Freq: Every day | ORAL | 2 refills | Status: DC

## 2020-01-04 ENCOUNTER — Other Ambulatory Visit: Payer: Self-pay | Admitting: Family Medicine

## 2020-01-07 ENCOUNTER — Encounter: Payer: Self-pay | Admitting: Family Medicine

## 2020-01-07 NOTE — Progress Notes (Signed)
Acute Office Visit  Subjective:    Patient ID: Ryan Garrison, male    DOB: 1930-05-12, 87 y.o.   MRN: [id number]  Chief Complaint  Patient presents with  . Stress    HPI Patient is in today with his wife who has severe dementia and has been falling.  She is very unstable.  He is under a great deal of stress related to this.  He worries about her all the time.  He is very anxious and sad at times.  Denies any thoughts of hurting himself. This was an add-on visit to hers so there was limited time available.  He is requesting something for his nerves.  Past Medical History:  Diagnosis Date  . Gout   . Hearing loss   . Hypertension   . Renal stones     Past Surgical History:  Procedure Laterality Date  . CHOLECYSTECTOMY    . HERNIA REPAIR Right    Inguinal    Family History  Problem Relation Age of Onset  . Leukemia Father     Social History   Socioeconomic History  . Marital status: Married    Spouse name: Terri Piedra  . Number of children: 3  . Years of education: Not on file  . Highest education level: Not on file  Occupational History  . Occupation: Retired  Tobacco Use  . Smoking status: Former Smoker    Types: Cigarettes    Quit date: 1954    Years since quitting: 68.0  . Smokeless tobacco: Never Used  Vaping Use  . Vaping Use: Never used  Substance and Sexual Activity  . Alcohol use: Never  . Drug use: Never  . Sexual activity: Not on file  Other Topics Concern  . Not on file  Social History Narrative   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   60 Brothers, 1 deceased   76 Sisters, 2 deceased   Social Determinants of Radio broadcast assistant Strain: Not on file  Food Insecurity: Not on file  Transportation Needs: Not on file  Physical Activity: Not on file  Stress: Not on file  Social Connections: Not on file  Intimate Partner Violence: Not on file    Outpatient Medications Prior to Visit  Medication Sig Dispense Refill  . acetaminophen (TYLENOL) 500 MG  tablet Take 500 mg by mouth 2 (two) times daily.    Marland Kitchen allopurinol (ZYLOPRIM) 300 MG tablet TAKE 1 TABLET EVERY DAY 90 tablet 0  . aspirin 81 MG chewable tablet Chew 81 mg by mouth daily.    Marland Kitchen atenolol-chlorthalidone (TENORETIC) 50-25 MG tablet TAKE 1/2 TABLET EVERY DAY 45 tablet 3  . escitalopram (LEXAPRO) 5 MG tablet Take 1 tablet (5 mg total) by mouth daily. 30 tablet 2  . finasteride (PROSCAR) 5 MG tablet Take 1 tablet (5 mg total) by mouth daily. 90 tablet 1  . LORazepam (ATIVAN) 0.5 MG tablet Take 1 tablet (0.5 mg total) by mouth daily as needed for anxiety or sleep. 30 tablet 1  . potassium chloride SA (KLOR-CON) 20 MEQ tablet TAKE 3 TABLETS DAILY ON MONDAYS, WEDNESDAYS, AND FRIDAYS AND TAKE 2 TABLETS DAILY ON ALL OTHER DAYS. 221 tablet 2   No facility-administered medications prior to visit.    No Known Allergies  Review of Systems  Constitutional: Negative for chills and fever.  Respiratory: Negative for chest tightness and shortness of breath.   Cardiovascular: Negative for chest pain.  Psychiatric/Behavioral: Negative for dysphoric mood. The patient is nervous/anxious.  Objective:    Physical Exam Vitals reviewed.  Constitutional:      Appearance: Normal appearance.  Cardiovascular:     Rate and Rhythm: Normal rate and regular rhythm.     Heart sounds: Normal heart sounds.  Pulmonary:     Effort: Pulmonary effort is normal.     Breath sounds: Normal breath sounds.  Neurological:     Mental Status: He is alert.  Psychiatric:     Comments: Anxious.     BP 102/80   Pulse 70   Temp 97.6 F (36.4 C)   Resp 16   Ht 5\' 11"  (1.803 m)   Wt 172 lb (78 kg)   BMI 23.99 kg/m  Wt Readings from Last 3 Encounters:  12/27/19 172 lb (78 kg)  08/18/19 175 lb (79.4 kg)  05/15/19 171 lb (77.6 kg)    Health Maintenance Due  Topic Date Due  . COVID-19 Vaccine (2 - Moderna 3-dose booster series) 04/19/2019    There are no preventive care reminders to display for  this patient.   Lab Results  Component Value Date   TSH 3.920 08/18/2019   Lab Results  Component Value Date   WBC 6.8 08/18/2019   HGB 14.8 08/18/2019   HCT 43.0 08/18/2019   MCV 94 08/18/2019   PLT 131 (L) 08/18/2019   Lab Results  Component Value Date   NA 141 08/18/2019   K 4.6 08/18/2019   CO2 25 08/18/2019   GLUCOSE 108 (H) 08/18/2019   BUN 17 08/18/2019   CREATININE 1.36 (H) 08/18/2019   BILITOT 0.6 08/18/2019   ALKPHOS 57 08/18/2019   AST 25 08/18/2019   ALT 13 08/18/2019   PROT 7.7 08/18/2019   ALBUMIN 4.4 08/18/2019   CALCIUM 9.9 08/18/2019   No results found for: CHOL No results found for: HDL No results found for: LDLCALC No results found for: TRIG No results found for: CHOLHDL No results found for: 08/20/2019     Assessment & Plan:  1. GAD (generalized anxiety disorder)  Started on lexapro 5 mg once daily and lorazepam 0.5 mg once daily prn severe anxiety  Follow-up: Return in about 4 weeks (around 01/24/2020) for Anxiety.  An After Visit Summary was printed and given to the patient.  01/26/2020, MD [facility] [phone]

## 2020-01-18 ENCOUNTER — Other Ambulatory Visit: Payer: Self-pay

## 2020-01-18 ENCOUNTER — Ambulatory Visit (INDEPENDENT_AMBULATORY_CARE_PROVIDER_SITE_OTHER): Payer: Medicare HMO | Admitting: Family Medicine

## 2020-01-18 ENCOUNTER — Encounter: Payer: Self-pay | Admitting: Family Medicine

## 2020-01-18 VITALS — BP 132/64 | HR 61 | Resp 18 | Ht 71.0 in | Wt 167.0 lb

## 2020-01-18 DIAGNOSIS — R109 Unspecified abdominal pain: Secondary | ICD-10-CM | POA: Diagnosis not present

## 2020-01-18 DIAGNOSIS — I10 Essential (primary) hypertension: Secondary | ICD-10-CM | POA: Diagnosis not present

## 2020-01-18 DIAGNOSIS — R7309 Other abnormal glucose: Secondary | ICD-10-CM

## 2020-01-18 MED ORDER — FAMOTIDINE 20 MG PO TABS: 20.0000 mg | ORAL_TABLET | Freq: Two times a day (BID) | ORAL | 1 refills | Status: DC

## 2020-01-18 NOTE — Progress Notes (Signed)
Established Patient Office Visit  Subjective:  Patient ID: Ryan Garrison, male    DOB: May 30, 1930  Age: 87 y.o. MRN: [id number]  CC:  Chief Complaint  Patient presents with  . Weight Loss    HPI Ryan Garrison presents for weight loss -caring for wife with dementia. Fatigue and anxiety a concern. Pt primary care giver with assistance from daughter and son-in law. Wants to keep wife at home. No fever, cough, congestion, diarrhea, or vomiting. Taking Lexapro for anxiety, HTN -taking Tenoretic. No recent gout flares. 5/21 weight 174, weight. Pt with stomach upset notes in midepigastric area, no lower abdominal pain -pt uses otc medications for reflux. Elevated glucose noted in the past-no h/o DM. Pt would like glucose test.    Past Medical History:  Diagnosis Date  . Gout   . Hearing loss   . Hypertension   . Renal stones     Past Surgical History:  Procedure Laterality Date  . CHOLECYSTECTOMY    . HERNIA REPAIR Right    Inguinal    Family History  Problem Relation Age of Onset  . Leukemia Father     Social History   Socioeconomic History  . Marital status: Married    Spouse name: Terri Piedra  . Number of children: 3  . Years of education: Not on file  . Highest education level: Not on file  Occupational History  . Occupation: Retired  Tobacco Use  . Smoking status: Former Smoker    Types: Cigarettes    Quit date: 1954    Years since quitting: 68.0  . Smokeless tobacco: Never Used  Vaping Use  . Vaping Use: Never used  Substance and Sexual Activity  . Alcohol use: Never  . Drug use: Never  . Sexual activity: Not on file  Other Topics Concern  . Not on file  Social History Narrative   Caregiver for Ryan Garrison wife   88 Brothers, 1 deceased   6 Sisters, 2 deceased   Social Determinants of Radio broadcast assistant Strain: Not on file  Food Insecurity: Not on file  Transportation Needs: Not on file  Physical Activity: Not on file  Stress: Not on file  Social  Connections: Not on file  Intimate Partner Violence: Not on file    Outpatient Medications Prior to Visit  Medication Sig Dispense Refill  . acetaminophen (TYLENOL) 500 MG tablet Take 500 mg by mouth 2 (two) times daily.    Marland Kitchen allopurinol (ZYLOPRIM) 300 MG tablet TAKE 1 TABLET EVERY DAY 90 tablet 0  . aspirin 81 MG chewable tablet Chew 81 mg by mouth daily.    Marland Kitchen atenolol-chlorthalidone (TENORETIC) 50-25 MG tablet TAKE 1/2 TABLET EVERY DAY 45 tablet 3  . escitalopram (LEXAPRO) 5 MG tablet Take 1 tablet (5 mg total) by mouth daily. 30 tablet 2  . finasteride (PROSCAR) 5 MG tablet Take 1 tablet (5 mg total) by mouth daily. 90 tablet 1  . LORazepam (ATIVAN) 0.5 MG tablet Take 1 tablet (0.5 mg total) by mouth daily as needed for anxiety or sleep. 30 tablet 1  . potassium chloride SA (KLOR-CON) 20 MEQ tablet TAKE 3 TABLETS DAILY ON MONDAYS, WEDNESDAYS, AND FRIDAYS AND TAKE 2 TABLETS DAILY ON ALL OTHER DAYS. 221 tablet 2  . traMADol (ULTRAM) 50 MG tablet      No facility-administered medications prior to visit.    No Known Allergies  ROS Review of Systems  Respiratory: Negative.   Gastrointestinal: Positive for abdominal pain. Negative  for abdominal distention, constipation, diarrhea, nausea and vomiting.  Endocrine: Negative.   Genitourinary: Negative.   Musculoskeletal: Negative.   Allergic/Immunologic: Negative.   Neurological: Negative.   Hematological: Negative.   Psychiatric/Behavioral: Positive for dysphoric mood. The patient is nervous/anxious.       Objective:    Physical Exam Constitutional:      Appearance: Normal appearance.  HENT:     Head: Normocephalic and atraumatic.  Cardiovascular:     Rate and Rhythm: Normal rate and regular rhythm.     Pulses: Normal pulses.     Heart sounds: Normal heart sounds.  Pulmonary:     Effort: Pulmonary effort is normal.     Breath sounds: Normal breath sounds.  Abdominal:     General: Bowel sounds are normal.     Palpations:  Abdomen is soft.  Musculoskeletal:     Cervical back: Normal range of motion and neck supple.  Neurological:     Mental Status: Ryan Garrison is alert and oriented to person, place, and time.  Psychiatric:        Mood and Affect: Mood normal.        Behavior: Behavior normal.     BP 132/64   Pulse 61   Resp 18   Ht 5' 11" (1.803 m)   Wt 167 lb (75.8 kg)   SpO2 97%   BMI 23.29 kg/m  Wt Readings from Last 3 Encounters:  01/18/20 167 lb (75.8 kg)  12/27/19 172 lb (78 kg)  08/18/19 175 lb (79.4 kg)     Health Maintenance Due  Topic Date Due  . COVID-19 Vaccine (3 - Booster) 09/22/2019    Lab Results  Component Value Date   TSH 3.920 08/18/2019   Lab Results  Component Value Date   WBC 6.8 08/18/2019   HGB 14.8 08/18/2019   HCT 43.0 08/18/2019   MCV 94 08/18/2019   PLT 131 (L) 08/18/2019   Lab Results  Component Value Date   NA 141 08/18/2019   K 4.6 08/18/2019   CO2 25 08/18/2019   GLUCOSE 108 (H) 08/18/2019   BUN 17 08/18/2019   CREATININE 1.36 (H) 08/18/2019   BILITOT 0.6 08/18/2019   ALKPHOS 57 08/18/2019   AST 25 08/18/2019   ALT 13 08/18/2019   PROT 7.7 08/18/2019   ALBUMIN 4.4 08/18/2019   CALCIUM 9.9 08/18/2019     Assessment & Plan:   1. Essential hypertension, benign Continue Tenoretic 2. Abdominal pain, unspecified abdominal location Concern for midepigastric pain-increase stress with care giving role taking care of wife - Hemoglobin A1c - CBC with Differential - CMP14+EGFR Start pepcid BID 3. Elevated glucose - Hemoglobin A1c  Lataysha Vohra Hannah Beat, MD

## 2020-01-19 DIAGNOSIS — I10 Essential (primary) hypertension: Secondary | ICD-10-CM | POA: Insufficient documentation

## 2020-01-19 DIAGNOSIS — R109 Unspecified abdominal pain: Secondary | ICD-10-CM | POA: Insufficient documentation

## 2020-01-19 DIAGNOSIS — R7309 Other abnormal glucose: Secondary | ICD-10-CM | POA: Insufficient documentation

## 2020-01-19 LAB — HEMOGLOBIN A1C
Est. average glucose Bld gHb Est-mCnc: 123 mg/dL
Hgb A1c MFr Bld: 5.9 % — ABNORMAL HIGH (ref 4.8–5.6)

## 2020-01-19 LAB — CBC WITH DIFFERENTIAL/PLATELET
Basophils Absolute: 0 10*3/uL (ref 0.0–0.2)
Basos: 1 %
EOS (ABSOLUTE): 0.2 10*3/uL (ref 0.0–0.4)
Eos: 3 %
Hematocrit: 41.9 % (ref 37.5–51.0)
Hemoglobin: 14.8 g/dL (ref 13.0–17.7)
Immature Grans (Abs): 0 10*3/uL (ref 0.0–0.1)
Immature Granulocytes: 0 %
Lymphocytes Absolute: 1.5 10*3/uL (ref 0.7–3.1)
Lymphs: 24 %
MCH: 33.7 pg — ABNORMAL HIGH (ref 26.6–33.0)
MCHC: 35.3 g/dL (ref 31.5–35.7)
MCV: 95 fL (ref 79–97)
Monocytes Absolute: 0.4 10*3/uL (ref 0.1–0.9)
Monocytes: 7 %
Neutrophils Absolute: 4.3 10*3/uL (ref 1.4–7.0)
Neutrophils: 65 %
Platelets: 157 10*3/uL (ref 150–450)
RBC: 4.39 x10E6/uL (ref 4.14–5.80)
RDW: 12.1 % (ref 11.6–15.4)
WBC: 6.5 10*3/uL (ref 3.4–10.8)

## 2020-01-19 LAB — CMP14+EGFR
ALT: 19 IU/L (ref 0–44)
AST: 24 IU/L (ref 0–40)
Albumin/Globulin Ratio: 1.6 (ref 1.2–2.2)
Albumin: 4.5 g/dL (ref 3.6–4.6)
Alkaline Phosphatase: 59 IU/L (ref 44–121)
BUN/Creatinine Ratio: 18 (ref 10–24)
BUN: 21 mg/dL (ref 8–27)
Bilirubin Total: 0.5 mg/dL (ref 0.0–1.2)
CO2: 28 mmol/L (ref 20–29)
Calcium: 9.7 mg/dL (ref 8.6–10.2)
Chloride: 98 mmol/L (ref 96–106)
Creatinine, Ser: 1.14 mg/dL (ref 0.76–1.27)
GFR calc Af Amer: 66 mL/min/{1.73_m2} (ref >59)
GFR calc non Af Amer: 58 mL/min/{1.73_m2} — ABNORMAL LOW (ref >59)
Globulin, Total: 2.9 g/dL (ref 1.5–4.5)
Glucose: 112 mg/dL — ABNORMAL HIGH (ref 65–99)
Potassium: 4.1 mmol/L (ref 3.5–5.2)
Sodium: 140 mmol/L (ref 134–144)
Total Protein: 7.4 g/dL (ref 6.0–8.5)

## 2020-01-30 ENCOUNTER — Other Ambulatory Visit: Payer: Self-pay

## 2020-01-30 ENCOUNTER — Ambulatory Visit (INDEPENDENT_AMBULATORY_CARE_PROVIDER_SITE_OTHER): Payer: Medicare HMO | Admitting: Family Medicine

## 2020-01-30 VITALS — BP 120/60 | HR 60 | Temp 97.1°F | Resp 16 | Ht 71.0 in | Wt 164.8 lb

## 2020-01-30 DIAGNOSIS — N400 Enlarged prostate without lower urinary tract symptoms: Secondary | ICD-10-CM

## 2020-01-30 DIAGNOSIS — I10 Essential (primary) hypertension: Secondary | ICD-10-CM | POA: Diagnosis not present

## 2020-01-30 DIAGNOSIS — K219 Gastro-esophageal reflux disease without esophagitis: Secondary | ICD-10-CM

## 2020-01-30 DIAGNOSIS — F330 Major depressive disorder, recurrent, mild: Secondary | ICD-10-CM

## 2020-01-30 NOTE — Progress Notes (Signed)
Subjective:  Patient ID: Ryan Garrison, male    DOB: 16-Dec-1930  Age: 87 y.o. MRN: [id number]  Chief Complaint  Patient presents with  . Hypertension    HPI  Depressed/Anxiety- Did not take lexapro 5 mg could not tell it was helping. Did not try Lorazepam. Poor appetite. Sleeping better than was. His 2 daughters are now helping more. They are each spending the night one day each per week.  Hypertension: On atenolol/chlorthalidone.  Gout: on allopurinol. No flare ups.  Gerd: on Pepcid.  BPH: on proscar.   Current Outpatient Medications on File Prior to Visit  Medication Sig Dispense Refill  . acetaminophen (TYLENOL) 500 MG tablet Take 500 mg by mouth 2 (two) times daily.    Marland Kitchen allopurinol (ZYLOPRIM) 300 MG tablet TAKE 1 TABLET EVERY DAY 90 tablet 0  . aspirin 81 MG chewable tablet Chew 81 mg by mouth daily.    Marland Kitchen atenolol-chlorthalidone (TENORETIC) 50-25 MG tablet TAKE 1/2 TABLET EVERY DAY 45 tablet 3  . escitalopram (LEXAPRO) 5 MG tablet Take 1 tablet (5 mg total) by mouth daily. 30 tablet 2  . famotidine (PEPCID) 20 MG tablet Take 1 tablet (20 mg total) by mouth 2 (two) times daily. 60 tablet 1  . finasteride (PROSCAR) 5 MG tablet Take 1 tablet (5 mg total) by mouth daily. 90 tablet 1  . LORazepam (ATIVAN) 0.5 MG tablet Take 1 tablet (0.5 mg total) by mouth daily as needed for anxiety or sleep. 30 tablet 1  . potassium chloride SA (KLOR-CON) 20 MEQ tablet TAKE 3 TABLETS DAILY ON MONDAYS, WEDNESDAYS, AND FRIDAYS AND TAKE 2 TABLETS DAILY ON ALL OTHER DAYS. 221 tablet 2  . traMADol (ULTRAM) 50 MG tablet      No current facility-administered medications on file prior to visit.   Past Medical History:  Diagnosis Date  . Gout   . Hearing loss   . Hypertension   . Renal stones    Past Surgical History:  Procedure Laterality Date  . CHOLECYSTECTOMY    . HERNIA REPAIR Right    Inguinal    Family History  Problem Relation Age of Onset  . Leukemia Father    Social History    Socioeconomic History  . Marital status: Married    Spouse name: Terri Piedra  . Number of children: 3  . Years of education: Not on file  . Highest education level: Not on file  Occupational History  . Occupation: Retired  Tobacco Use  . Smoking status: Former Smoker    Types: Cigarettes    Quit date: 1954    Years since quitting: 68.1  . Smokeless tobacco: Never Used  Vaping Use  . Vaping Use: Never used  Substance and Sexual Activity  . Alcohol use: Never  . Drug use: Never  . Sexual activity: Not on file  Other Topics Concern  . 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  HENT: Negative for congestion, rhinorrhea and sore throat.   Respiratory: Positive for shortness of breath. Negative for cough.   Cardiovascular: Negative for chest  pain and palpitations.  Gastrointestinal: Positive for constipation and nausea. Negative for abdominal pain, diarrhea and vomiting.  Genitourinary: Negative for dysuria and urgency.  Musculoskeletal: Negative for arthralgias, back pain and myalgias.  Neurological: Positive for dizziness. Negative for headaches.  Psychiatric/Behavioral: Positive for dysphoric mood. The patient is nervous/anxious.      Objective:  BP 120/60   Pulse 60   Temp (!) 97.1 F (36.2 C)   Resp 16   Ht 5\' 11"  (1.803 m)   Wt 164 lb 12.8 oz (74.8 kg)   BMI 22.98 kg/m   BP/Weight 01/30/2020 01/18/2020 02/54/2706  Systolic BP 237 628 315  Diastolic BP 60 64 80  Wt. (Lbs) 164.8 167 172  BMI 22.98 23.29 23.99    Physical Exam Vitals reviewed.  Constitutional:      Appearance: Normal appearance.   Cardiovascular:     Rate and Rhythm: Normal rate and regular rhythm.     Heart sounds: Normal heart sounds.  Pulmonary:     Effort: Pulmonary effort is normal.     Breath sounds: Normal breath sounds. No wheezing, rhonchi or rales.  Neurological:     Mental Status: He is alert.  Psychiatric:        Mood and Affect: Mood normal.        Behavior: Behavior normal.     Diabetic Foot Exam - Simple   No data filed      Lab Results  Component Value Date   WBC 6.5 01/18/2020   HGB 14.8 01/18/2020   HCT 41.9 01/18/2020   PLT 157 01/18/2020   GLUCOSE 112 (H) 01/18/2020   ALT 19 01/18/2020   AST 24 01/18/2020   NA 140 01/18/2020   K 4.1 01/18/2020   CL 98 01/18/2020   CREATININE 1.14 01/18/2020   BUN 21 01/18/2020   CO2 28 01/18/2020   TSH 3.920 08/18/2019   HGBA1C 5.9 (H) 01/18/2020      Assessment & Plan:   1. Essential hypertension, benign Well controlled. Continue current medicines.   2. Mild recurrent major depression (HCC) Recommended trial on lexapro 5 mg once daily for at least 2-3 weeks.  He needs to give it the opportunity to help.   3. Benign prostatic hyperplasia without lower urinary tract symptoms Continue proscar.  4. Gastroesophageal reflux disease without esophagitis  Continue pepcid.     Follow-up: Return in about 6 months (around 07/29/2020).  An After Visit Summary was printed and given to the patient.  Rochel Brome, MD [facility] [phone]

## 2020-02-03 ENCOUNTER — Encounter: Payer: Self-pay | Admitting: Family Medicine

## 2020-02-07 ENCOUNTER — Telehealth: Payer: Self-pay | Admitting: Physician Assistant

## 2020-02-07 NOTE — Telephone Encounter (Signed)
Pt would like you to send in another medication for his bowels --- the one you had given him previously is not working well

## 2020-02-07 NOTE — Telephone Encounter (Signed)
What is patient taking now? Recommend Miralax one twice a day.

## 2020-02-08 NOTE — Telephone Encounter (Signed)
Attempted to call pt. Pt does not accept private calls.

## 2020-02-09 NOTE — Telephone Encounter (Signed)
Spoke with pt made him aware of information, he was not able to tell me what medication he was using for his bowels.

## 2020-02-27 ENCOUNTER — Ambulatory Visit (INDEPENDENT_AMBULATORY_CARE_PROVIDER_SITE_OTHER): Payer: Medicare HMO | Admitting: Family Medicine

## 2020-02-27 ENCOUNTER — Other Ambulatory Visit: Payer: Self-pay

## 2020-02-27 ENCOUNTER — Telehealth: Payer: Self-pay

## 2020-02-27 VITALS — BP 110/52 | HR 72 | Temp 97.5°F | Resp 18 | Wt 161.0 lb

## 2020-02-27 DIAGNOSIS — K625 Hemorrhage of anus and rectum: Secondary | ICD-10-CM

## 2020-02-27 DIAGNOSIS — F330 Major depressive disorder, recurrent, mild: Secondary | ICD-10-CM

## 2020-02-27 DIAGNOSIS — F411 Generalized anxiety disorder: Secondary | ICD-10-CM | POA: Diagnosis not present

## 2020-02-27 DIAGNOSIS — F5102 Adjustment insomnia: Secondary | ICD-10-CM

## 2020-02-27 DIAGNOSIS — K648 Other hemorrhoids: Secondary | ICD-10-CM

## 2020-02-27 MED ORDER — ESCITALOPRAM OXALATE 5 MG PO TABS: 5.0000 mg | ORAL_TABLET | Freq: Every day | ORAL | 2 refills | Status: DC

## 2020-02-27 MED ORDER — ALPRAZOLAM 0.25 MG PO TABS
0.2500 mg | ORAL_TABLET | Freq: Every day | ORAL | 1 refills | Status: DC
Start: 2020-02-27 — End: 2020-06-29

## 2020-02-27 NOTE — Patient Instructions (Addendum)
Depression/Anxiety: Take LEXAPRO (ESCITALOPRAM) 5 MG ONCE DAILY  STOP lorazepam 0.5 once daily as needed for severe anxiety.  Start on xanax 0.25 mg once daily at night   Refer to Dr. Melina Copa for blood in stool.

## 2020-02-27 NOTE — Progress Notes (Signed)
Acute Office Visit  Subjective:    Patient ID: Ryan Garrison, male    DOB: 02/17/1930, 87 y.o.   MRN: [id number]  Chief Complaint  Patient presents with  . Blood In Stools    HPI Patient is in today for blood in his stools, hx of hemorrhoids several times in the past and Preparation H helped in the past. He is requesting referral to GI. He is reluctant to have me do an exam.   Depression and anxiety. Pt is taking lexapro 5 mg pr instead of scheduled. Pt is requesting xanax. His daughter gave him 1/2 of a xanax and he was able to sleep. Lorazepam did not help him sleep. He is still very anxious related to his wife's progressive dementia.   Past Medical History:  Diagnosis Date  . Gout   . Hearing loss   . Hypertension   . Renal stones     Past Surgical History:  Procedure Laterality Date  . CHOLECYSTECTOMY    . HERNIA REPAIR Right    Inguinal    Family History  Problem Relation Age of Onset  . Leukemia Father     Social History   Socioeconomic History  . Marital status: Married    Spouse name: Terri Piedra  . Number of children: 3  . Years of education: Not on file  . Highest education level: Not on file  Occupational History  . Occupation: Retired  Tobacco Use  . Smoking status: Former Smoker    Types: Cigarettes    Quit date: 1954    Years since quitting: 70.2  . Smokeless tobacco: Never Used  Vaping Use  . Vaping Use: Never used  Substance and Sexual Activity  . Alcohol use: Never  . Drug use: Never  . Sexual activity: Not on file  Other Topics Concern  . 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 acetaminophen (TYLENOL) 500 MG tablet Take 500 mg by mouth 2 (two) times daily.    Marland Kitchen allopurinol (ZYLOPRIM) 300 MG tablet TAKE 1 TABLET EVERY DAY 90 tablet 0  . aspirin 81 MG chewable tablet Chew 81 mg by mouth daily.    Marland Kitchen atenolol-chlorthalidone (TENORETIC) 50-25 MG tablet TAKE 1/2 TABLET EVERY DAY 45 tablet 3  . famotidine (PEPCID) 20 MG tablet Take 1 tablet (20 mg total) by mouth 2 (two) times daily. 60 tablet 1  . finasteride (PROSCAR) 5 MG tablet Take 1 tablet (5 mg total) by mouth daily. 90 tablet 1  . LORazepam (ATIVAN) 0.5 MG tablet Take 1 tablet (0.5 mg total) by mouth daily as needed for anxiety or sleep. 30 tablet 1  . potassium chloride SA (KLOR-CON) 20 MEQ tablet TAKE 3 TABLETS DAILY ON MONDAYS, WEDNESDAYS, AND FRIDAYS AND TAKE 2 TABLETS DAILY ON ALL OTHER DAYS. 221 tablet 2  . traMADol (ULTRAM) 50 MG tablet     . escitalopram (LEXAPRO) 5 MG tablet Take 1 tablet (5 mg total) by mouth daily. 30 tablet 2   No facility-administered medications prior to  visit.    No Known Allergies  Review of Systems  Constitutional: Positive for unexpected weight change. Negative for chills and fever.  HENT: Negative for congestion, rhinorrhea and sore throat.   Respiratory: Positive for shortness of breath. Negative for cough.   Cardiovascular: Negative for chest pain and palpitations.  Gastrointestinal: Positive for abdominal pain, blood in stool, constipation, nausea and vomiting. Negative for diarrhea.  Genitourinary: Negative for dysuria and urgency.  Musculoskeletal: Negative for arthralgias, back pain and myalgias.  Neurological: Negative for dizziness and headaches.  Psychiatric/Behavioral: Positive for dysphoric mood. The patient is not nervous/anxious.        Objective:    Physical Exam Vitals reviewed.  Constitutional:      Appearance: Normal appearance. He is normal weight.  Cardiovascular:     Rate and Rhythm:  Normal rate and regular rhythm.     Heart sounds: No murmur heard.   Pulmonary:     Effort: Pulmonary effort is normal.     Breath sounds: Normal breath sounds.  Genitourinary:    Comments: Patient declined rectal exam. Neurological:     Mental Status: He is alert.     BP (!) 110/52   Pulse 72   Temp (!) 97.5 F (36.4 C)   Resp 18   Wt 161 lb (73 kg)   BMI 22.45 kg/m  Wt Readings from Last 3 Encounters:  02/27/20 161 lb (73 kg)  01/30/20 164 lb 12.8 oz (74.8 kg)  01/18/20 167 lb (75.8 kg)    Health Maintenance Due  Topic Date Due  . COVID-19 Vaccine (3 - Booster) 09/22/2019    There are no preventive care reminders to display for this patient.   Lab Results  Component Value Date   TSH 3.920 08/18/2019   Lab Results  Component Value Date   WBC 9.1 02/27/2020   HGB 15.5 02/27/2020   HCT 45.6 02/27/2020   MCV 96 02/27/2020   PLT 165 02/27/2020   Lab Results  Component Value Date   NA 140 01/18/2020   K 4.1 01/18/2020   CO2 28 01/18/2020   GLUCOSE 112 (H) 01/18/2020   BUN 21 01/18/2020   CREATININE 1.14 01/18/2020   BILITOT 0.5 01/18/2020   ALKPHOS 59 01/18/2020   AST 24 01/18/2020   ALT 19 01/18/2020   PROT 7.4 01/18/2020   ALBUMIN 4.5 01/18/2020   CALCIUM 9.7 01/18/2020   No results found for: CHOL No results found for: HDL No results found for: LDLCALC No results found for: TRIG No results found for: CHOLHDL Lab Results  Component Value Date   HGBA1C 5.9 (H) 01/18/2020       Assessment & Plan:  1. Bright red blood per rectum - CBC with Differential/Platelet - Ambulatory referral to Gastroenterology  2. Other hemorrhoids Recommended continue using Prep H.   3. Mild recurrent major depression (HCC) Recommended take lexapro 5 mg once daily.   4. GAD (generalized anxiety disorder) Change lorazepam to xanax 0.25 mg once daily prn qhs.  Discussed importance of not taking other peoples' medicines.  5. Adjustment insomnia Xanax 0.25 mg  once daily prn qhs.    Follow-up: No follow-ups on file.  An After Visit Summary was printed and given to the patient.  Rochel Brome, MD [facility] [phone]

## 2020-02-27 NOTE — Telephone Encounter (Signed)
Appt today. Kc

## 2020-02-27 NOTE — Telephone Encounter (Signed)
Pt called stating he is having bright red blood in his stool. "Quite a bit" this morning. States he hasn't been checked in a few years by Dr. Melina Copa. Appointment made for today at 2:45.   [name] 02/27/20 8:48 AM

## 2020-02-28 LAB — CBC WITH DIFFERENTIAL/PLATELET
Basophils Absolute: 0.1 10*3/uL (ref 0.0–0.2)
Basos: 1 %
EOS (ABSOLUTE): 0.2 10*3/uL (ref 0.0–0.4)
Eos: 2 %
Hematocrit: 45.6 % (ref 37.5–51.0)
Hemoglobin: 15.5 g/dL (ref 13.0–17.7)
Immature Grans (Abs): 0 10*3/uL (ref 0.0–0.1)
Immature Granulocytes: 0 %
Lymphocytes Absolute: 1.8 10*3/uL (ref 0.7–3.1)
Lymphs: 19 %
MCH: 32.6 pg (ref 26.6–33.0)
MCHC: 34 g/dL (ref 31.5–35.7)
MCV: 96 fL (ref 79–97)
Monocytes Absolute: 0.4 10*3/uL (ref 0.1–0.9)
Monocytes: 5 %
Neutrophils Absolute: 6.6 10*3/uL (ref 1.4–7.0)
Neutrophils: 73 %
Platelets: 165 10*3/uL (ref 150–450)
RBC: 4.75 x10E6/uL (ref 4.14–5.80)
RDW: 12.6 % (ref 11.6–15.4)
WBC: 9.1 10*3/uL (ref 3.4–10.8)

## 2020-03-09 DIAGNOSIS — R051 Acute cough: Secondary | ICD-10-CM | POA: Diagnosis not present

## 2020-03-09 DIAGNOSIS — J189 Pneumonia, unspecified organism: Secondary | ICD-10-CM | POA: Diagnosis not present

## 2020-03-15 DIAGNOSIS — R972 Elevated prostate specific antigen [PSA]: Secondary | ICD-10-CM | POA: Diagnosis not present

## 2020-03-15 DIAGNOSIS — N401 Enlarged prostate with lower urinary tract symptoms: Secondary | ICD-10-CM | POA: Diagnosis not present

## 2020-03-24 ENCOUNTER — Encounter: Payer: Self-pay | Admitting: Family Medicine

## 2020-03-25 ENCOUNTER — Other Ambulatory Visit: Payer: Self-pay | Admitting: Family Medicine

## 2020-03-25 DIAGNOSIS — R062 Wheezing: Secondary | ICD-10-CM | POA: Diagnosis not present

## 2020-03-25 DIAGNOSIS — J209 Acute bronchitis, unspecified: Secondary | ICD-10-CM | POA: Diagnosis not present

## 2020-04-16 ENCOUNTER — Other Ambulatory Visit: Payer: Self-pay

## 2020-04-16 MED ORDER — ATENOLOL-CHLORTHALIDONE 50-25 MG PO TABS: 0.5000 | ORAL_TABLET | Freq: Every day | ORAL | 0 refills | Status: DC

## 2020-04-17 DIAGNOSIS — H2703 Aphakia, bilateral: Secondary | ICD-10-CM | POA: Diagnosis not present

## 2020-05-06 ENCOUNTER — Ambulatory Visit (INDEPENDENT_AMBULATORY_CARE_PROVIDER_SITE_OTHER): Payer: Medicare HMO | Admitting: Family Medicine

## 2020-05-06 ENCOUNTER — Other Ambulatory Visit: Payer: Self-pay

## 2020-05-06 VITALS — BP 130/64 | HR 55 | Temp 97.3°F | Ht 71.0 in | Wt 170.0 lb

## 2020-05-06 DIAGNOSIS — F330 Major depressive disorder, recurrent, mild: Secondary | ICD-10-CM

## 2020-05-06 DIAGNOSIS — I10 Essential (primary) hypertension: Secondary | ICD-10-CM

## 2020-05-06 DIAGNOSIS — F5102 Adjustment insomnia: Secondary | ICD-10-CM

## 2020-05-06 DIAGNOSIS — F411 Generalized anxiety disorder: Secondary | ICD-10-CM

## 2020-05-06 DIAGNOSIS — K219 Gastro-esophageal reflux disease without esophagitis: Secondary | ICD-10-CM | POA: Diagnosis not present

## 2020-05-06 MED ORDER — ESCITALOPRAM OXALATE 5 MG PO TABS: 5.0000 mg | ORAL_TABLET | Freq: Every day | ORAL | 2 refills | Status: DC

## 2020-05-06 MED ORDER — OMEPRAZOLE 40 MG PO CPDR: 40.0000 mg | DELAYED_RELEASE_CAPSULE | Freq: Every day | ORAL | 5 refills | Status: AC

## 2020-05-06 NOTE — Patient Instructions (Addendum)
START LEXAPRO 5 MG ONCE DAILY.  START OMEPRAZOLE 20 MG ONCE DAILY BEFORE LARGEST MEAL OF DAY.  MAY STOP PEPCID (FAMOTIDINE) CONTINUE OTHER MEDICINES.

## 2020-05-06 NOTE — Progress Notes (Signed)
Subjective:  Patient ID: Ryan Garrison, male    DOB: 1930/07/07  Age: 87 y.o. MRN: [id number]  Chief Complaint  Patient presents with  . Depression  . Gastroesophageal Reflux  . Hypertension    HPI GERD:  Has abdominal pain and reflux with certain foods. Does take famotidine, but not sure if it is helping. No chest pain.   HTN: atenolol and chlorlthalidone.  Blood pressure is well controlled.  GAD: not taking lexapro 5 mg once daily. Takes xanax 0.25 mg 1/2 daily.  Patient has continued anxiety surrounding his wife's dementia.  Current Outpatient Medications on File Prior to Visit  Medication Sig Dispense Refill  . acetaminophen (TYLENOL) 500 MG tablet Take 500 mg by mouth 2 (two) times daily.    Marland Kitchen allopurinol (ZYLOPRIM) 300 MG tablet TAKE 1 TABLET EVERY DAY 90 tablet 0  . ALPRAZolam (XANAX) 0.25 MG tablet Take 1 tablet (0.25 mg total) by mouth at bedtime. 30 tablet 1  . aspirin 81 MG chewable tablet Chew 81 mg by mouth daily.    Marland Kitchen atenolol-chlorthalidone (TENORETIC) 50-25 MG tablet Take 0.5 tablets by mouth daily. 45 tablet 0  . finasteride (PROSCAR) 5 MG tablet Take 1 tablet (5 mg total) by mouth daily. 90 tablet 1  . potassium chloride SA (KLOR-CON) 20 MEQ tablet TAKE 3 TABLETS DAILY ON MONDAYS, WEDNESDAYS, AND FRIDAYS AND TAKE 2 TABLETS DAILY ON ALL OTHER DAYS. 221 tablet 2   No current facility-administered medications on file prior to visit.   Past Medical History:  Diagnosis Date  . Gout   . Hearing loss   . Hypertension   . Renal stones    Past Surgical History:  Procedure Laterality Date  . CHOLECYSTECTOMY    . HERNIA REPAIR Right    Inguinal    Family History  Problem Relation Age of Onset  . Leukemia Father    Social History   Socioeconomic History  . Marital status: Married    Spouse name: Terri Piedra  . Number of children: 3  . Years of education: Not on file  . Highest education level: Not on file  Occupational History  . Occupation: Retired   Tobacco Use  . Smoking status: Former Smoker    Types: Cigarettes    Quit date: 1954    Years since quitting: 68.4  . Smokeless tobacco: Never Used  Vaping Use  . Vaping Use: Never used  Substance and Sexual Activity  . Alcohol use: Never  . Drug use: Never  . Sexual activity: Not on file  Other Topics Concern  . Not on file  Social History Narrative   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   85 Brothers, 1 deceased   39 Sisters, 2 deceased   Social Determinants of Radio broadcast assistant Strain: Not on file  Food Insecurity: Not on file  Transportation Needs: Not on file  Physical Activity: Not on file  Stress: Not on file  Social Connections: Not on file    Review of Systems  Constitutional: Negative for chills, diaphoresis, fatigue and fever.  HENT: Negative for congestion, ear pain and sore throat.   Respiratory: Negative for cough and shortness of breath.   Cardiovascular: Negative for chest pain and leg swelling.  Gastrointestinal: Negative for abdominal pain, constipation, diarrhea, nausea and vomiting.  Genitourinary: Negative for dysuria and urgency.  Musculoskeletal: Negative for arthralgias and myalgias.  Neurological: Negative for dizziness and headaches.  Psychiatric/Behavioral: Positive for dysphoric mood. Negative for sleep disturbance (Not if he  takes the Xanax.). The patient is nervous/anxious.      Objective:  BP 130/64   Pulse (!) 55   Temp (!) 97.3 F (36.3 C)   Ht 5\' 11"  (1.803 m)   Wt 170 lb (77.1 kg)   SpO2 97%   BMI 23.71 kg/m   BP/Weight 05/06/2020 02/27/2020 3/97/6734  Systolic BP 193 790 240  Diastolic BP 64 52 60  Wt. (Lbs) 170 161 164.8  BMI 23.71 22.45 22.98    Physical Exam Vitals reviewed.  Constitutional:      Appearance: Normal appearance. He is normal weight.  Cardiovascular:     Rate and Rhythm: Normal rate and regular rhythm.     Heart sounds: No murmur heard.   Pulmonary:     Effort: Pulmonary effort is normal.     Breath  sounds: Normal breath sounds.  Abdominal:     General: Abdomen is flat. Bowel sounds are normal.     Palpations: Abdomen is soft.     Tenderness: There is no abdominal tenderness.  Neurological:     Mental Status: He is alert and oriented to person, place, and time.  Psychiatric:        Mood and Affect: Mood normal.        Behavior: Behavior normal.     Diabetic Foot Exam - Simple   No data filed      Lab Results  Component Value Date   WBC 9.1 02/27/2020   HGB 15.5 02/27/2020   HCT 45.6 02/27/2020   PLT 165 02/27/2020   GLUCOSE 112 (H) 01/18/2020   ALT 19 01/18/2020   AST 24 01/18/2020   NA 140 01/18/2020   K 4.1 01/18/2020   CL 98 01/18/2020   CREATININE 1.14 01/18/2020   BUN 21 01/18/2020   CO2 28 01/18/2020   TSH 3.920 08/18/2019   HGBA1C 5.9 (H) 01/18/2020      Assessment & Plan:  1. Essential hypertension, benign The current medical regimen is effective;  continue present plan and medications.   2. Gastroesophageal reflux disease without esophagitis  START OMEPRAZOLE 20 MG ONCE DAILY BEFORE LARGEST MEAL OF DAY. MAY STOP PEPCID (FAMOTIDINE)3. Mild recurrent major depression ([place])  3. Mild recurrent major depression (HCC) Start lexapro 5 mg once daily  4. GAD (generalized anxiety disorder) Start lexapro 5 mg once daily. May take xanax 0.25 mg once at night prn insomnia and anxiety.  5. Adjustment insomnia See above   Follow-up: Return in about 3 months (around 08/06/2020).  An After Visit Summary was printed and given to the patient.  Rochel Brome, MD [facility] [phone]

## 2020-05-16 ENCOUNTER — Encounter: Payer: Self-pay | Admitting: Family Medicine

## 2020-06-04 ENCOUNTER — Telehealth: Payer: Self-pay | Admitting: Family Medicine

## 2020-06-04 NOTE — Chronic Care Management (AMB) (Signed)
  Chronic Care Management   Note  06/04/2020 Name: Ryan Garrison MRN: [id number] DOB: 22-Apr-1930  [name] is a 87 y.o. year old male who is a primary care patient of [facility], Kirsten, MD. I reached out to [name] by phone today in response to a referral sent by [name]'s PCP, [name], MD.   [name] was given information about Chronic Care Management services today including:  1. CCM service includes personalized support from designated clinical staff supervised by his physician, including individualized plan of care and coordination with other care providers 2. 24/7 contact phone numbers for assistance for urgent and routine care needs. 3. Service will only be billed when office clinical staff spend 20 minutes or more in a month to coordinate care. 4. Only one practitioner may furnish and bill the service in a calendar month. 5. The patient may stop CCM services at any time (effective at the end of the month) by phone call to the office staff.   Patient agreed to services and verbal consent obtained.   Follow up plan:   Tatjana Secretary/administrator

## 2020-06-18 ENCOUNTER — Telehealth: Payer: Self-pay

## 2020-06-18 DIAGNOSIS — R21 Rash and other nonspecific skin eruption: Secondary | ICD-10-CM | POA: Diagnosis not present

## 2020-06-18 DIAGNOSIS — S30860A Insect bite (nonvenomous) of lower back and pelvis, initial encounter: Secondary | ICD-10-CM | POA: Diagnosis not present

## 2020-06-18 DIAGNOSIS — A932 Colorado tick fever: Secondary | ICD-10-CM | POA: Diagnosis not present

## 2020-06-18 NOTE — Telephone Encounter (Signed)
Mr. Reine called to report that his wife is doing very poorly.  He has been informed by Hospice that it will not be very long.  He has anxiety medication that he takes daily prn.  He called to get permission to take the medication bid prn.  Marge Duncans, PA approved the xanax 0.25 mg twice daily prn.

## 2020-06-18 NOTE — Telephone Encounter (Signed)
Patient came by office wanted nurse to look at a site he pulled the tick from, patient stated he pulled tick off last Monday and just wanted site looked at. Site was a little red with a ring, patient denied fever, chills, and all other symptoms.  Patient stated he could not wait to be seen due to his wife being very ill and would call and make an appointment if he started having symptoms.

## 2020-06-28 ENCOUNTER — Other Ambulatory Visit: Payer: Self-pay | Admitting: Physician Assistant

## 2020-06-29 ENCOUNTER — Emergency Department (HOSPITAL_COMMUNITY)
Admission: EM | Admit: 2020-06-29 | Discharge: 2020-06-29 | Disposition: A | Discharge status: Home / Self Care | Payer: Medicare HMO | Attending: Emergency Medicine | Admitting: Emergency Medicine

## 2020-06-29 ENCOUNTER — Other Ambulatory Visit: Payer: Self-pay

## 2020-06-29 ENCOUNTER — Encounter (HOSPITAL_COMMUNITY): Payer: Self-pay

## 2020-06-29 ENCOUNTER — Emergency Department (HOSPITAL_COMMUNITY): Payer: Medicare HMO

## 2020-06-29 DIAGNOSIS — R079 Chest pain, unspecified: Secondary | ICD-10-CM | POA: Diagnosis not present

## 2020-06-29 DIAGNOSIS — Z79899 Other long term (current) drug therapy: Secondary | ICD-10-CM | POA: Insufficient documentation

## 2020-06-29 DIAGNOSIS — Z87891 Personal history of nicotine dependence: Secondary | ICD-10-CM | POA: Diagnosis not present

## 2020-06-29 DIAGNOSIS — Z7982 Long term (current) use of aspirin: Secondary | ICD-10-CM | POA: Insufficient documentation

## 2020-06-29 DIAGNOSIS — R0902 Hypoxemia: Secondary | ICD-10-CM | POA: Diagnosis not present

## 2020-06-29 DIAGNOSIS — R0789 Other chest pain: Secondary | ICD-10-CM | POA: Diagnosis not present

## 2020-06-29 DIAGNOSIS — I10 Essential (primary) hypertension: Secondary | ICD-10-CM | POA: Insufficient documentation

## 2020-06-29 DIAGNOSIS — I959 Hypotension, unspecified: Secondary | ICD-10-CM | POA: Diagnosis not present

## 2020-06-29 DIAGNOSIS — R457 State of emotional shock and stress, unspecified: Secondary | ICD-10-CM | POA: Diagnosis not present

## 2020-06-29 LAB — URINALYSIS, ROUTINE W REFLEX MICROSCOPIC
Bilirubin Urine: NEGATIVE
Glucose, UA: NEGATIVE mg/dL
Hgb urine dipstick: NEGATIVE
Ketones, ur: NEGATIVE mg/dL
Leukocytes,Ua: NEGATIVE
Nitrite: NEGATIVE
Protein, ur: NEGATIVE mg/dL
Specific Gravity, Urine: 1.014 (ref 1.005–1.030)
pH: 7 (ref 5.0–8.0)

## 2020-06-29 LAB — CBC
HCT: 41.3 % (ref 39.0–52.0)
Hemoglobin: 14.5 g/dL (ref 13.0–17.0)
MCH: 33.9 pg (ref 26.0–34.0)
MCHC: 35.1 g/dL (ref 30.0–36.0)
MCV: 96.5 fL (ref 80.0–100.0)
Platelets: 146 10*3/uL — ABNORMAL LOW (ref 150–400)
RBC: 4.28 MIL/uL (ref 4.22–5.81)
RDW: 12.7 % (ref 11.5–15.5)
WBC: 6.9 10*3/uL (ref 4.0–10.5)
nRBC: 0 % (ref 0.0–0.2)

## 2020-06-29 LAB — BASIC METABOLIC PANEL
Anion gap: 10 (ref 5–15)
BUN: 23 mg/dL (ref 8–23)
CO2: 27 mmol/L (ref 22–32)
Calcium: 9.6 mg/dL (ref 8.9–10.3)
Chloride: 97 mmol/L — ABNORMAL LOW (ref 98–111)
Creatinine, Ser: 1.1 mg/dL (ref 0.61–1.24)
GFR, Estimated: >60 mL/min (ref >60)
Glucose, Bld: 146 mg/dL — ABNORMAL HIGH (ref 70–99)
Potassium: 3.5 mmol/L (ref 3.5–5.1)
Sodium: 134 mmol/L — ABNORMAL LOW (ref 135–145)

## 2020-06-29 LAB — TROPONIN I (HIGH SENSITIVITY)
Troponin I (High Sensitivity): 12 ng/L (ref <18)
Troponin I (High Sensitivity): 12 ng/L (ref <18)

## 2020-06-29 MED ORDER — ALUM & MAG HYDROXIDE-SIMETH 200-200-20 MG/5ML PO SUSP
30.0000 mL | Freq: Once | ORAL | Status: AC
  Administered 2020-06-29: 30 mL via ORAL
  Filled 2020-06-29: qty 30

## 2020-06-29 MED ORDER — ALPRAZOLAM 0.25 MG PO TABS: 0.2500 mg | ORAL_TABLET | Freq: Two times a day (BID) | ORAL | 0 refills | Status: AC | PRN

## 2020-06-29 NOTE — ED Triage Notes (Signed)
Pt arrives from home via EMS. EMS reports pt's wife died a few days ago, reports yesterday and today pt is experiencing chest tightness on right side, increasing with deep inhalation. Pt denies center or left sided cp. Pt's daughter on scene reported that pt was supposed to be on some type of anxiety medication but none were found on scene and pt denies taking any. Pt reports being on doxy for several days for tick bite which pt thinks is related to chest tightness. Pt denies being on any other medications however EMS found several BP medications on scene that appear he has not been taking. Ap A/O. 20g IV established in right hand. BP 140/64 HR 56 RR 18 O2 98% RA BGL 121

## 2020-06-29 NOTE — Discharge Instructions (Signed)
Stop doxycycline.

## 2020-06-29 NOTE — ED Provider Notes (Signed)
[facility] EMERGENCY DEPARTMENT Provider Note   CSN: [id number] Arrival date & time: 06/29/20  0754     History Chief Complaint  Patient presents with   Chest Pain    Ryan Garrison is a 87 y.o. male.  Pt presents to the ED today with CP.  Pt said it hurts when he takes a deep breath.  His wife died a few days ago.  He has been under a lot of stress because they were "close."  He had been taking Xanax 0.25 mg bid prn, but does not have any more.  He does not feel like he is super anxious.  He also notes he has been taking doxycycline for several days for a tick bite.  He said cp started after taking that med.  He denies any sob or cough or fever.      Past Medical History:  Diagnosis Date   Gout    Hearing loss    Hypertension    Renal stones     Patient Active Problem List   Diagnosis Date Noted   Essential hypertension, benign 01/19/2020   Abdominal pain 01/19/2020   Elevated glucose 01/19/2020   Acute laryngopharyngitis 05/15/2019   Acute cystitis without hematuria 05/15/2019    Past Surgical History:  Procedure Laterality Date   CHOLECYSTECTOMY     HERNIA REPAIR Right    Inguinal       Family History  Problem Relation Age of Onset   Leukemia Father     Social History   Tobacco Use   Smoking status: Former    Pack years: 0.00    Types: Cigarettes    Quit date: 1954    Years since quitting: 68.5   Smokeless tobacco: Never  Vaping Use   Vaping Use: Never used  Substance Use Topics   Alcohol use: Never   Drug use: Never    Home Medications Prior to Admission medications   Medication Sig Start Date End Date Taking? Authorizing Provider  acetaminophen (TYLENOL) 500 MG tablet Take 500 mg by mouth 2 (two) times daily.   Yes [provider]  allopurinol (ZYLOPRIM) 300 MG tablet TAKE 1 TABLET EVERY DAY Patient taking differently: Take 300 mg by mouth daily. 07/28/19  Yes Cox, Kirsten, MD  ALPRAZolam Duanne Moron) 0.25 MG tablet  Take 1 tablet (0.25 mg total) by mouth 2 (two) times daily as needed for anxiety. 06/29/20  Yes Isla Pence, MD  aspirin 81 MG chewable tablet Chew 81 mg by mouth daily.   Yes [provider]  atenolol-chlorthalidone (TENORETIC) 50-25 MG tablet TAKE 1/2 TABLET BY MOUTH DAILY Patient taking differently: Take 0.5 tablets by mouth daily. 06/28/20  Yes Marge Duncans, PA-C  doxycycline (VIBRAMYCIN) 100 MG capsule Take 100 mg by mouth 2 (two) times daily. 06/25/20  Yes [provider]  escitalopram (LEXAPRO) 5 MG tablet Take 1 tablet (5 mg total) by mouth daily. 05/06/20  Yes Cox, Kirsten, MD  finasteride (PROSCAR) 5 MG tablet Take 1 tablet (5 mg total) by mouth daily. 04/21/19  Yes Cox, Kirsten, MD  omeprazole (PRILOSEC) 40 MG capsule Take 1 capsule (40 mg total) by mouth daily. Patient taking differently: Take 40 mg by mouth daily as needed (indigestion/heartburn). 05/06/20  Yes Cox, Kirsten, MD  potassium chloride SA (KLOR-CON) 20 MEQ tablet TAKE 3 TABLETS DAILY ON MONDAYS, WEDNESDAYS, AND FRIDAYS AND TAKE 2 TABLETS DAILY ON ALL OTHER DAYS. Patient taking differently: Take 40 mEq by mouth daily. 12/25/19  Yes Cox,  Elnita Maxwell, MD    Allergies    Patient has no known allergies.  Review of Systems   Review of Systems  Cardiovascular:  Positive for chest pain.  All other systems reviewed and are negative.  Physical Exam Updated Vital Signs BP 125/70   Pulse (!) 56   Temp 98.3 F (36.8 C) (Oral)   Resp 20   Ht 5\' 10"  (1.778 m)   Wt 74.8 kg   SpO2 98%   BMI 23.68 kg/m   Physical Exam Vitals and nursing note reviewed.  Constitutional:      Appearance: He is well-developed.  HENT:     Head: Normocephalic and atraumatic.  Eyes:     Extraocular Movements: Extraocular movements intact.     Pupils: Pupils are equal, round, and reactive to light.  Cardiovascular:     Rate and Rhythm: Normal rate and regular rhythm.     Heart sounds: Normal heart sounds.  Pulmonary:     Effort:  Pulmonary effort is normal.     Breath sounds: Normal breath sounds.  Abdominal:     General: Bowel sounds are normal.     Palpations: Abdomen is soft.  Musculoskeletal:        General: Normal range of motion.     Cervical back: Normal range of motion and neck supple.  Skin:    Capillary Refill: Capillary refill takes less than 2 seconds.  Neurological:     General: No focal deficit present.     Mental Status: He is alert and oriented to person, place, and time.  Psychiatric:        Mood and Affect: Mood normal.        Behavior: Behavior normal.    ED Results / Procedures / Treatments   Labs (all labs ordered are listed, but only abnormal results are displayed) Labs Reviewed  BASIC METABOLIC PANEL - Abnormal; Notable for the following components:      Result Value   Sodium 134 (*)    Chloride 97 (*)    Glucose, Bld 146 (*)    All other components within normal limits  CBC - Abnormal; Notable for the following components:   Platelets 146 (*)    All other components within normal limits  URINALYSIS, ROUTINE W REFLEX MICROSCOPIC  TROPONIN I (HIGH SENSITIVITY)  TROPONIN I (HIGH SENSITIVITY)    EKG EKG Interpretation  Date/Time:  Saturday June 29 2020 08:46:49 EDT Ventricular Rate:  54 PR Interval:  195 QRS Duration: 159 QT Interval:  464 QTC Calculation: 440 R Axis:   -65 Text Interpretation: Sinus rhythm RBBB and LAFB No old tracing to compare Confirmed by Isla Pence [phone]) on 06/29/2020 8:52:31 AM  Radiology DG Chest Port 1 View  Result Date: 06/29/2020 CLINICAL DATA:  Chest pain on the right. Pain increases with deep inhalation for 2 days EXAM: PORTABLE CHEST 1 VIEW COMPARISON:  08/10/2017 FINDINGS: Generous lung volumes, unchanged. There is relative elevation of the left diaphragm. There is no edema, consolidation, effusion, or pneumothorax. Normal heart size and mediastinal contours. 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 Artifact from EKG  leads.  Right upper quadrant clips. IMPRESSION: Stable from 2019.  No acute finding. Electronically Signed   By: Monte Fantasia M.D.   On: 06/29/2020 08:53    Procedures Procedures   Medications Ordered in ED Medications  alum & mag hydroxide-simeth (MAALOX/MYLANTA) 200-200-20 MG/5ML suspension 30 mL (30 mLs Oral Given 06/29/20 5361)    ED Course  I have reviewed  the triage vital signs and the nursing notes.  Pertinent labs & imaging results that were available during my care of the patient were reviewed by me and considered in my medical decision making (see chart for details).    MDM Rules/Calculators/A&P                          CP is better after gi cocktail.  He requests some anxiety meds for home.  I suspect cp is from doxy induced esophagitis + stress.    His cardiac work up is nl.  Pt is stable for d/c.  Stop doxy. Return if worse. F/u with pcp.  Final Clinical Impression(s) / ED Diagnoses Final diagnoses:  Atypical chest pain    Rx / DC Orders ED Discharge Orders          Ordered    ALPRAZolam (XANAX) 0.25 MG tablet  2 times daily PRN        06/29/20 1031             Isla Pence, MD 06/29/20 1323

## 2020-07-02 ENCOUNTER — Ambulatory Visit (INDEPENDENT_AMBULATORY_CARE_PROVIDER_SITE_OTHER): Payer: Medicare HMO | Admitting: Nurse Practitioner

## 2020-07-02 ENCOUNTER — Other Ambulatory Visit: Payer: Self-pay

## 2020-07-02 ENCOUNTER — Encounter: Payer: Self-pay | Admitting: Nurse Practitioner

## 2020-07-02 VITALS — BP 120/68 | HR 56 | Temp 98.2°F | Ht 71.0 in | Wt 168.0 lb

## 2020-07-02 DIAGNOSIS — R0789 Other chest pain: Secondary | ICD-10-CM

## 2020-07-02 DIAGNOSIS — F411 Generalized anxiety disorder: Secondary | ICD-10-CM | POA: Diagnosis not present

## 2020-07-02 DIAGNOSIS — F4321 Adjustment disorder with depressed mood: Secondary | ICD-10-CM | POA: Diagnosis not present

## 2020-07-02 NOTE — Progress Notes (Signed)
Subjective:  Patient ID: Ryan Garrison, male    DOB: 12-24-1930  Age: 87 y.o. MRN: [id number]  Chief Complaint  Patient presents with   Hospitalization Follow-up    HPI  Welcome is an 87 year old Caucasian male that presents for hospital. He unexpectedly lost his spouse of 29 years last week. He was seen at [facility] on 06/29/20 for chest pain. States the physicians told him it was related to anxiety and grief. He denies current chest pain or dyspnea. States he is taking Xanax 0.25 mg daily for anxiety, which has improved symptoms. He tells me that he has good emotional support of family and friends. States he may travel to [place] where he grew up this weekend to visit his extended family.  Follow up Hospitalization  Patient was admitted to [facility] ER on 06/29/2020 and discharged on 07/01/2020. He was treated for A-typical chest pain/Anxiety. Treatment for this included GI cocktail and Alprazolam 0.25mg  x2 daily prn anxiety. Patient recently had lost his spouse who passed away and was experiencing intense grief.   He reports good compliance with treatment. He reports this condition is improved.   Current Outpatient Medications on File Prior to Visit  Medication Sig Dispense Refill   acetaminophen (TYLENOL) 500 MG tablet Take 500 mg by mouth 2 (two) times daily.     allopurinol (ZYLOPRIM) 300 MG tablet TAKE 1 TABLET EVERY DAY (Patient taking differently: Take 300 mg by mouth daily.) 90 tablet 0   ALPRAZolam (XANAX) 0.25 MG tablet Take 1 tablet (0.25 mg total) by mouth 2 (two) times daily as needed for anxiety. 30 tablet 0   aspirin 81 MG chewable tablet Chew 81 mg by mouth daily.     atenolol-chlorthalidone (TENORETIC) 50-25 MG tablet TAKE 1/2 TABLET BY MOUTH DAILY (Patient taking differently: Take 0.5 tablets by mouth daily.) 45 tablet 0   escitalopram (LEXAPRO) 5 MG tablet Take 1 tablet (5 mg total) by mouth daily. 30 tablet 2   finasteride (PROSCAR) 5 MG tablet Take 1  tablet (5 mg total) by mouth daily. 90 tablet 1   omeprazole (PRILOSEC) 40 MG capsule Take 1 capsule (40 mg total) by mouth daily. (Patient taking differently: Take 40 mg by mouth daily as needed (indigestion/heartburn).) 30 capsule 5   potassium chloride SA (KLOR-CON) 20 MEQ tablet TAKE 3 TABLETS DAILY ON MONDAYS, WEDNESDAYS, AND FRIDAYS AND TAKE 2 TABLETS DAILY ON ALL OTHER DAYS. (Patient taking differently: Take 40 mEq by mouth daily.) 221 tablet 2   No current facility-administered medications on file prior to visit.   Past Medical History:  Diagnosis Date   Gout    Hearing loss    Hypertension    Renal stones    Past Surgical History:  Procedure Laterality Date   CHOLECYSTECTOMY     HERNIA REPAIR Right    Inguinal    Family History  Problem Relation Age of Onset   Leukemia Father    Social History   Socioeconomic History   Marital status: Widowed    Spouse name: Gracie   Number of children: 3   Years of education: Not on file   Highest education level: Not on file  Occupational History   Occupation: Retired  Tobacco Use   Smoking status: Former    Pack years: 0.00    Types: Cigarettes    Quit date: 1954    Years since quitting: 85   Smokeless tobacco: Never  Vaping Use   Vaping Use: Never used  Substance and Sexual Activity   Alcohol use: Never   Drug use: Never   Sexual activity: Not on file  Other Topics Concern   Not on file  Social History Narrative   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 Negative for fatigue and fever.  HENT:  Negative for congestion, ear pain and sore throat.   Respiratory:  Negative for  cough and shortness of breath.   Cardiovascular:  Negative for chest pain and leg swelling.  Gastrointestinal:  Negative for abdominal pain, constipation, diarrhea, nausea and vomiting.  Genitourinary:  Negative for dysuria and frequency.  Musculoskeletal:  Negative for arthralgias and myalgias.  Neurological:  Negative for dizziness and headaches.  Psychiatric/Behavioral:  Negative for dysphoric mood. The patient is nervous/anxious.     Objective:  BP 120/68 (BP Location: Left Arm, Patient Position: Sitting)   Pulse (!) 56   Temp 98.2 F (36.8 C) (Temporal)   Ht 5\' 11"  (1.803 m)   Wt 168 lb (76.2 kg)   SpO2 98%   BMI 23.43 kg/m   BP/Weight 07/02/2020 1/75/1025 08/09/2776  Systolic BP 242 353 614  Diastolic BP 68 70 64  Wt. (Lbs) 168 165 170  BMI 23.43 23.68 23.71    Physical Exam Vitals reviewed.  Constitutional:      Appearance: Normal appearance.  HENT:     Right Ear: Tympanic membrane and external ear normal.     Left Ear: Tympanic membrane and external ear normal.     Nose: Nose normal.     Mouth/Throat:     Mouth: Mucous membranes are moist.  Eyes:     Pupils: Pupils are equal, round, and reactive to light.  Cardiovascular:     Rate and Rhythm: Normal rate and regular rhythm.     Pulses: Normal pulses.     Heart sounds: Normal heart sounds.  Pulmonary:     Breath sounds: Normal breath sounds.  Abdominal:     General: Abdomen is flat. Bowel sounds are normal.     Palpations: Abdomen is soft.  Musculoskeletal:        General: Normal range of motion.     Cervical back: Normal range of motion.  Skin:    General: Skin is warm and dry.  Neurological:     Mental Status: He is alert and oriented to person, place, and time.  Psychiatric:        Mood and Affect: Mood normal.        Behavior: Behavior normal.    Diabetic Foot Exam - Simple   No data filed      Lab Results  Component Value Date   WBC 6.9 06/29/2020   HGB 14.5 06/29/2020   HCT 41.3  06/29/2020   PLT 146 (L) 06/29/2020   GLUCOSE 146 (H) 06/29/2020   ALT 19 01/18/2020   AST 24 01/18/2020   NA 134 (L) 06/29/2020   K 3.5 06/29/2020   CL 97 (L) 06/29/2020   CREATININE 1.10 06/29/2020   BUN 23 06/29/2020   CO2 27 06/29/2020   TSH 3.920 08/18/2019   HGBA1C 5.9 (H) 01/18/2020  Assessment & Plan:    1. Grief -Continue to reach out to family, friends for support  2. Other chest pain -seek emergency medical care for chest pain or shortness of breath  3. GAD (generalized anxiety disorder)  -Take Xanax 0.25 mg as prescribed  Continue medications  Seek emergency medical care for chest pain or shortness or breath Follow-up with Dr Tobie Poet 73-months   Follow-up: 55-months   I, Lauren Peterson [place] as a scribe for [employer], NP.,have documented all relevant documentation on the behalf of Rip Harbour, NP,as directed by  Rip Harbour, NP while in the presence of Rip Harbour, NP.   I, Rip Harbour, NP, have reviewed all documentation for this visit. The documentation on 07/02/20 for the exam, diagnosis, procedures, and orders are all accurate and complete.   An After Visit Summary was printed and given to the patient.  Rip Harbour, NP [place] [phone]

## 2020-07-02 NOTE — Patient Instructions (Addendum)
Continue medications  Seek emergency medical care for chest pain or shortness or breath Follow-up with Dr Tobie Poet 83-months  Managing Loss, Adult People experience loss in many different ways throughout their lives. Events such as moving, changing jobs, and losing friends can create a sense of loss. The loss may be as serious as a major health change, divorce, death of a pet, or death of a loved one. All of these types of loss are likely to create a physical and emotional reaction known as grief. Grief is the result of a major change or an absence of something or someone that you count on. Grief is anormal reaction to loss. A variety of factors can affect your grieving experience, including: The nature of your loss. Your relationship to what or whom you lost. Your understanding of grief and how to manage it. Your support system. How to manage lifestyle changes Keep to your normal routine as much as possible. If you have trouble focusing or doing normal activities, it is acceptable to take some time away from your normal routine. Spend time with friends and loved ones. Eat a healthy diet, get plenty of sleep, and rest when you feel tired. How to recognize changes  The way that you deal with your grief will affect your ability to function as you normally do. When grieving, you may experience these changes: Numbness, shock, sadness, anxiety, anger, denial, and guilt. Thoughts about death. Unexpected crying. A physical sensation of emptiness in your stomach. Problems sleeping and eating. Tiredness (fatigue). Loss of interest in normal activities. Dreaming about or imagining seeing the person who died. A need to remember what or whom you lost. Difficulty thinking about anything other than your loss for a period of time. Relief. If you have been expecting the loss for a while, you may feel a sense of relief when it happens. Follow these instructions at home: Activity Express your feelings in  healthy ways, such as: Talking with others about your loss. It may be helpful to find others who have had a similar loss, such as a support group. Writing down your feelings in a journal. Doing physical activities to release stress and emotional energy. Doing creative activities like painting, sculpting, or playing or listening to music. Practicing resilience. This is the ability to recover and adjust after facing challenges. Reading some resources that encourage resilience may help you to learn ways to practice those behaviors.  General instructions Be patient with yourself and others. Allow the grieving process to happen, and remember that grieving takes time. It is likely that you may never feel completely done with some grief. You may find a way to move on while still cherishing memories and feelings about your loss. Accepting your loss is a process. It can take months or longer to adjust. Keep all follow-up visits as told by your health care provider. This is important. Where to find support To get support for managing loss: Ask your health care provider for help and recommendations, such as grief counseling or therapy. Think about joining a support group for people who are managing a loss. Where to find more information You can find more information about managing loss from: American Society of Clinical Oncology: www.cancer.net American Psychological Association: TVStere[employer].ch Contact a health care provider if: Your grief is extreme and keeps getting worse. You have ongoing grief that does not improve. Your body shows symptoms of grief, such as illness. You feel depressed, anxious, or lonely. Get help right away if: You have  thoughts about hurting yourself or others. If you ever feel like you may hurt yourself or others, or have thoughts about taking your own life, get help right away. You can go to your nearest emergency department or call: Your local emergency services (911 in the  [place] A suicide crisis helpline, such as the [place] at 725-797-9320. This is open 24 hours a day. Summary Grief is the result of a major change or an absence of someone or something that you count on. Grief is a normal reaction to loss. The depth of grief and the period of recovery depend on the type of loss and your ability to adjust to the change and process your feelings. Processing grief requires patience and a willingness to accept your feelings and talk about your loss with people who are supportive. It is important to find resources that work for you and to realize that people experience grief differently. There is not one grieving process that works for everyone in the same way. Be aware that when grief becomes extreme, it can lead to more severe issues like isolation, depression, anxiety, or suicidal thoughts. Talk with your health care provider if you have any of these issues. This information is not intended to replace advice given to you by your health care provider. Make sure you discuss any questions you have with your healthcare provider. Document Revised: 06/15/2019 Document Reviewed: 06/15/2019 Elsevier Patient Education  [place].

## 2020-07-16 DIAGNOSIS — Z0000 Encounter for general adult medical examination without abnormal findings: Secondary | ICD-10-CM | POA: Diagnosis not present

## 2020-07-16 DIAGNOSIS — Z136 Encounter for screening for cardiovascular disorders: Secondary | ICD-10-CM | POA: Diagnosis not present

## 2020-07-16 DIAGNOSIS — R7301 Impaired fasting glucose: Secondary | ICD-10-CM | POA: Diagnosis not present

## 2020-07-16 DIAGNOSIS — M25511 Pain in right shoulder: Secondary | ICD-10-CM | POA: Diagnosis not present

## 2020-07-16 DIAGNOSIS — E876 Hypokalemia: Secondary | ICD-10-CM | POA: Diagnosis not present

## 2020-07-16 DIAGNOSIS — M109 Gout, unspecified: Secondary | ICD-10-CM | POA: Diagnosis not present

## 2020-07-16 DIAGNOSIS — Z125 Encounter for screening for malignant neoplasm of prostate: Secondary | ICD-10-CM | POA: Diagnosis not present

## 2020-07-16 DIAGNOSIS — Z1211 Encounter for screening for malignant neoplasm of colon: Secondary | ICD-10-CM | POA: Diagnosis not present

## 2020-07-16 DIAGNOSIS — Z634 Disappearance and death of family member: Secondary | ICD-10-CM | POA: Diagnosis not present

## 2020-07-22 ENCOUNTER — Telehealth: Payer: Self-pay

## 2020-07-22 NOTE — Progress Notes (Signed)
    Chronic Care Management Pharmacy Assistant   Name: Ryan Garrison  MRN: [id number] DOB: 1930/02/29  Ryan Garrison is an 87 y.o. year old male who presents for his initial CCM visit with the clinical pharmacist.   Conditions to be addressed/monitored: HTN   Recent office visits:  07/02/20-Shannon Montez Morita NP (PCP), hospital follow up for grief, no medication changes, follow up 3 months  05/06/20-Dr Cox PCP, depression, provider stopped Tramadol and Lorazepam, Started on Omeprazole 40mg . Follow up 3 months  03/25/20-Mark Jake Michaelis, Family Medicine, acute bronchitis, no note available  03/09/20-Mark Jake Michaelis, Family Medicine, acute cough, pneumonia, no note available  02/27/20-Dr Cox PCP, Blood in stool, labs ordered, referral to gastroenterology, start  Alprazolam 0.25mg  Lab results: Blood count normal. Pt notified by Hoyle Sauer, [place].  [facility]   01/30/20-Dr Cox PCP, hypertension, no medication changes, follow up 6 months  Recent consult visits:  04/17/20-Tony Gilford Silvius, new patient exam  03/15/20-Roberto Nila Nephew, Urology, Elevated prostate specific antigen (PSA) Benign prostatic hyperplasia with lower urinary tract symptoms  Hospital visits:  06/29/20-[facility]-  Chest pain/Grief, discharged 07/01/20 Start Alprazolam 0.25mg  TID as needed.   Medications: Outpatient Encounter Medications as of 07/22/2020  Medication Sig   acetaminophen (TYLENOL) 500 MG tablet Take 500 mg by mouth 2 (two) times daily.   allopurinol (ZYLOPRIM) 300 MG tablet TAKE 1 TABLET EVERY DAY (Patient taking differently: Take 300 mg by mouth daily.)   ALPRAZolam (XANAX) 0.25 MG tablet Take 1 tablet (0.25 mg total) by mouth 2 (two) times daily as needed for anxiety.   aspirin 81 MG chewable tablet Chew 81 mg by mouth daily.   atenolol-chlorthalidone (TENORETIC) 50-25 MG tablet TAKE 1/2 TABLET BY MOUTH DAILY (Patient taking differently: Take 0.5 tablets by mouth daily.)   escitalopram (LEXAPRO) 5 MG tablet Take 1 tablet (5 mg  total) by mouth daily.   finasteride (PROSCAR) 5 MG tablet Take 1 tablet (5 mg total) by mouth daily.   omeprazole (PRILOSEC) 40 MG capsule Take 1 capsule (40 mg total) by mouth daily. (Patient taking differently: Take 40 mg by mouth daily as needed (indigestion/heartburn).)   potassium chloride SA (KLOR-CON) 20 MEQ tablet TAKE 3 TABLETS DAILY ON MONDAYS, WEDNESDAYS, AND FRIDAYS AND TAKE 2 TABLETS DAILY ON ALL OTHER DAYS. (Patient taking differently: Take 40 mEq by mouth daily.)   No facility-administered encounter medications on file as of 07/22/2020.    Lab Results  Component Value Date/Time   HGBA1C 5.9 (H) 01/18/2020 02:16 PM     BP Readings from Last 3 Encounters:  07/02/20 120/68  06/29/20 129/70  05/06/20 130/64    Patient cancelled 07/30/20 appointment   Star Rating Drugs:  Medication:     Last Fill: Day Supply Atenolol/Chlorthalidone 04/16/20 90  Care Gaps: Last annual wellness visit? None listed If applicable: Last eye exam / retinopathy screening? 04/17/20 Last diabetic foot exam? None listed  Clarita Leber, [place] Pharmacist Assistant [phone]

## 2020-07-30 ENCOUNTER — Ambulatory Visit: Payer: Medicare HMO

## 2020-08-09 ENCOUNTER — Ambulatory Visit: Payer: Medicare HMO | Admitting: Family Medicine

## 2020-08-20 DIAGNOSIS — J069 Acute upper respiratory infection, unspecified: Secondary | ICD-10-CM | POA: Diagnosis not present

## 2020-08-29 ENCOUNTER — Other Ambulatory Visit: Payer: Self-pay | Admitting: Family Medicine

## 2020-09-24 ENCOUNTER — Other Ambulatory Visit: Payer: Self-pay | Admitting: Physician Assistant

## 2020-09-24 ENCOUNTER — Other Ambulatory Visit: Payer: Self-pay | Admitting: Family Medicine

## 2020-09-24 NOTE — Telephone Encounter (Signed)
Refill sent to pharmacy.   

## 2020-10-08 DIAGNOSIS — R972 Elevated prostate specific antigen [PSA]: Secondary | ICD-10-CM | POA: Diagnosis not present

## 2020-10-08 DIAGNOSIS — N401 Enlarged prostate with lower urinary tract symptoms: Secondary | ICD-10-CM | POA: Diagnosis not present

## 2020-10-10 DIAGNOSIS — Z20828 Contact with and (suspected) exposure to other viral communicable diseases: Secondary | ICD-10-CM | POA: Diagnosis not present

## 2020-10-10 DIAGNOSIS — R509 Fever, unspecified: Secondary | ICD-10-CM | POA: Diagnosis not present

## 2020-10-16 DIAGNOSIS — R051 Acute cough: Secondary | ICD-10-CM | POA: Diagnosis not present

## 2020-10-16 DIAGNOSIS — Z20828 Contact with and (suspected) exposure to other viral communicable diseases: Secondary | ICD-10-CM | POA: Diagnosis not present

## 2020-10-16 DIAGNOSIS — R509 Fever, unspecified: Secondary | ICD-10-CM | POA: Diagnosis not present

## 2020-10-17 DIAGNOSIS — U071 COVID-19: Secondary | ICD-10-CM | POA: Diagnosis not present

## 2020-11-02 ENCOUNTER — Other Ambulatory Visit: Payer: Self-pay | Admitting: Family Medicine

## 2020-11-12 DIAGNOSIS — G6289 Other specified polyneuropathies: Secondary | ICD-10-CM | POA: Diagnosis not present

## 2020-11-12 DIAGNOSIS — D692 Other nonthrombocytopenic purpura: Secondary | ICD-10-CM | POA: Diagnosis not present

## 2020-11-12 DIAGNOSIS — R7301 Impaired fasting glucose: Secondary | ICD-10-CM | POA: Diagnosis not present

## 2020-11-12 DIAGNOSIS — E538 Deficiency of other specified B group vitamins: Secondary | ICD-10-CM | POA: Diagnosis not present

## 2020-11-12 DIAGNOSIS — R739 Hyperglycemia, unspecified: Secondary | ICD-10-CM | POA: Diagnosis not present

## 2020-11-12 DIAGNOSIS — I10 Essential (primary) hypertension: Secondary | ICD-10-CM | POA: Diagnosis not present

## 2020-11-23 ENCOUNTER — Other Ambulatory Visit: Payer: Self-pay | Admitting: Family Medicine

## 2022-08-27 IMAGING — DX DG CHEST 1V PORT
1 series · 2 of 2 positions shown · non-contrast
Comparison: 08/10/2017

CLINICAL DATA: Chest pain on the right. Pain increases with deep
inhalation for 2 days

EXAM:
PORTABLE CHEST 1 VIEW

[Series 1: chest · 0.14mm/px · 2 of 2 slices shown]
[im 1/2]
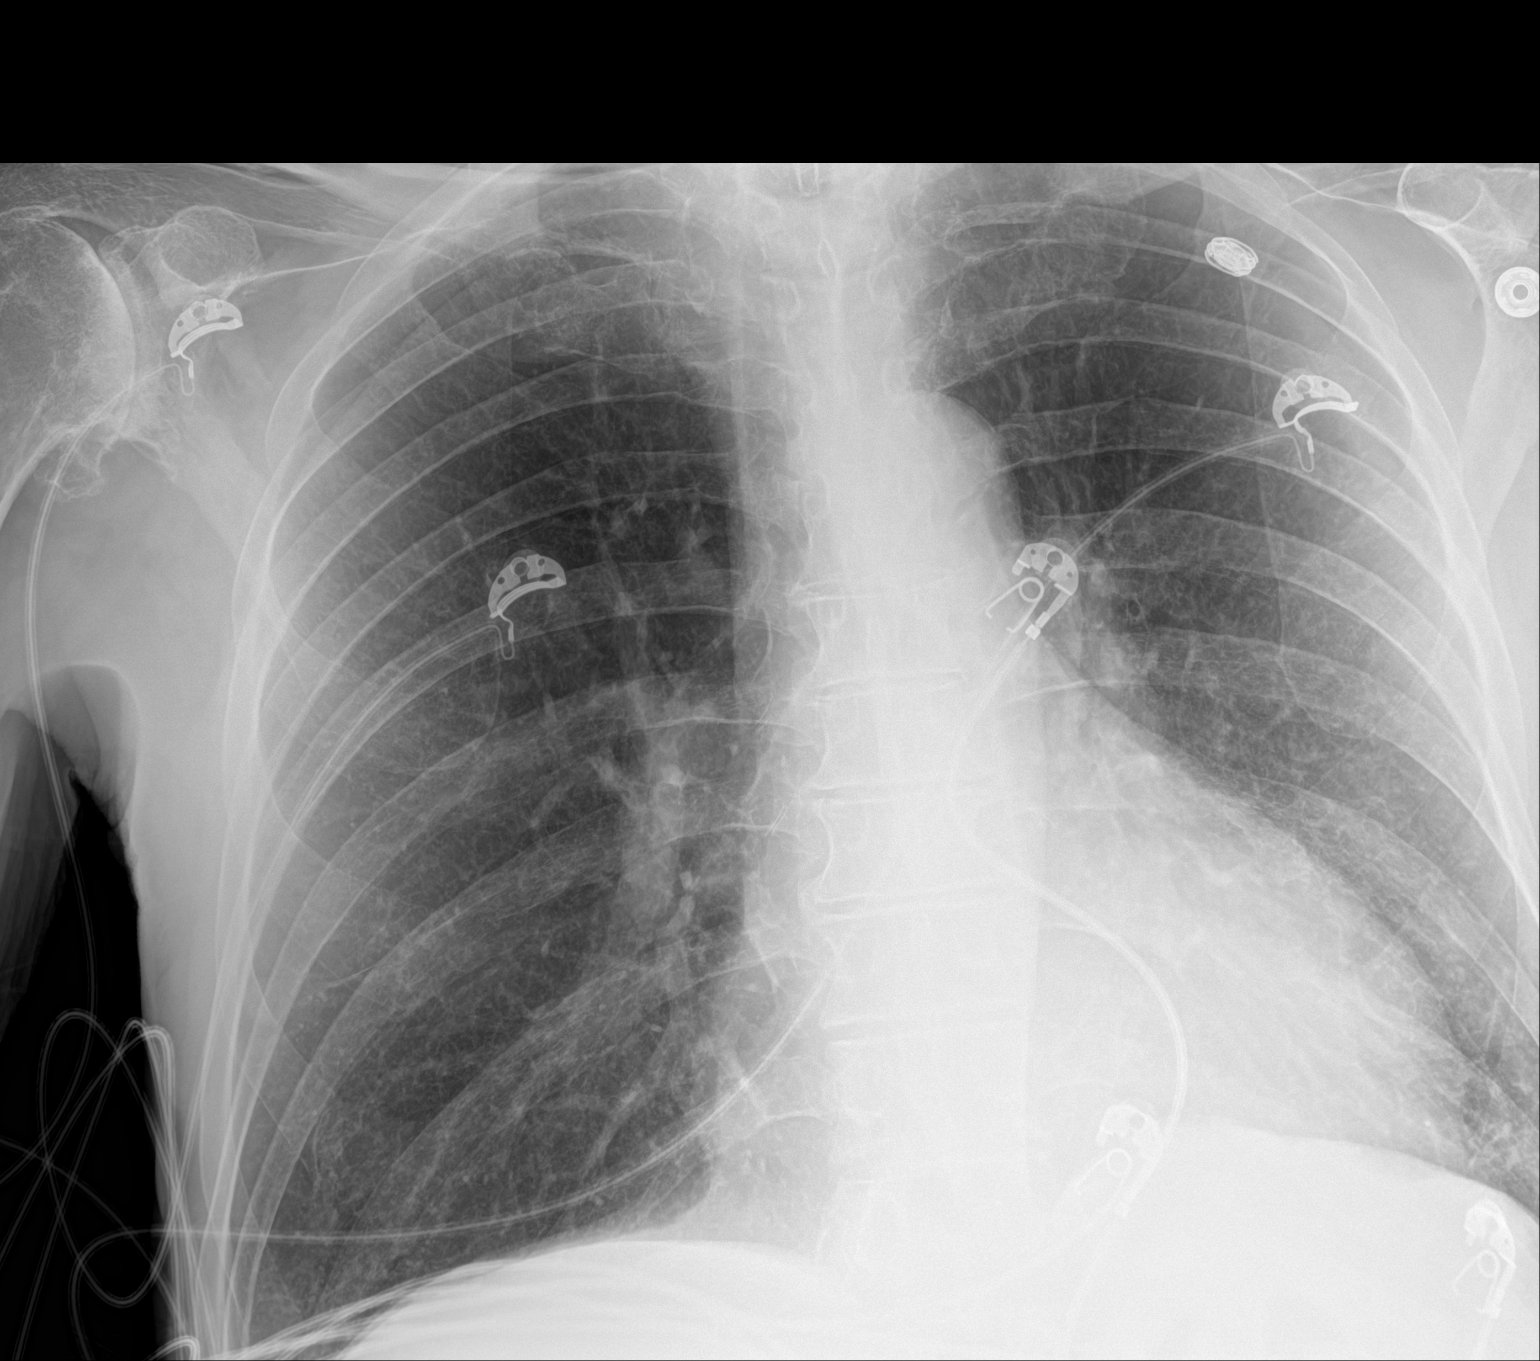
[im 2/2]
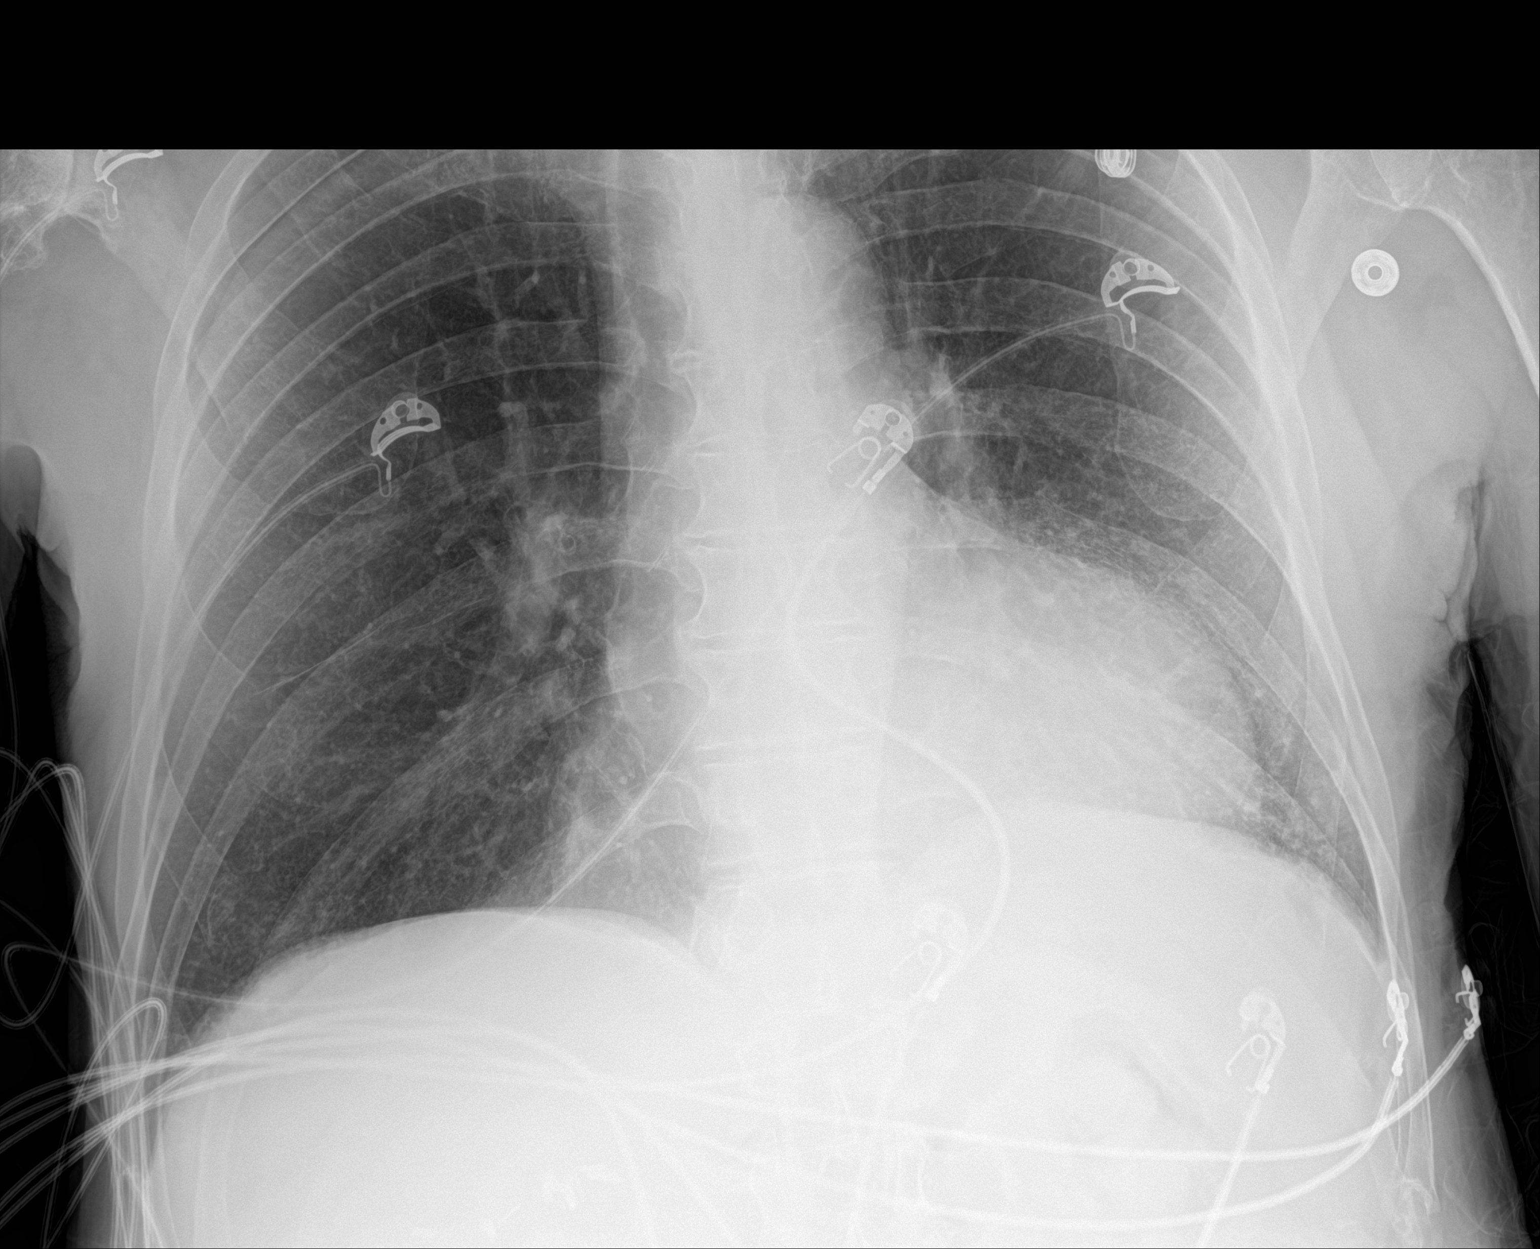

[2 of 2 positions shown; findings below may reference images not displayed]

FINDINGS: Generous lung volumes, unchanged. There is relative elevation of the
left diaphragm. There is no edema, consolidation, effusion, or
pneumothorax. Normal heart size and mediastinal contours. Notably
advanced glenohumeral osteoarthritis on the right.

Artifact from EKG leads.  Right upper quadrant clips.
IMPRESSION: Stable from 8290.  No acute finding.

## 2024-01-14 ENCOUNTER — Ambulatory Visit: Payer: Self-pay

## 2024-01-14 NOTE — Telephone Encounter (Signed)
 FYI Only or Action Required?: FYI only for provider: appointment scheduled on 01/18/24.  Patient was last seen in primary care on establishing care next week.  Called Nurse Triage reporting Cough.  Symptoms began several weeks ago.  Interventions attempted: OTC medications: Tylenol and Prescription medications: previously prescribed benzonatate.  Symptoms are: stable.  Triage Disposition: Home Care  Patient/caregiver understands and will follow disposition?: Yes                                  1. ONSET: When did the cough begin?      2-3 weeks ago, had the flu Christmas week 2. SEVERITY: How bad is the cough today?      Mild, but lingering  3. SPUTUM: Describe the color of your sputum (e.g., none, dry cough; clear, white, yellow, green)     White 4. HEMOPTYSIS: Are you coughing up any blood? If Yes, ask: How much? (e.g., flecks, streaks, tablespoons, etc.)     Denies 5. DIFFICULTY BREATHING: Are you having difficulty breathing? If Yes, ask: How bad is it? (e.g., mild, moderate, severe)      Denies, patient able to speak in clear and complete sentences while on phone with this RN  6. FEVER: Do you have a fever? If Yes, ask: What is your temperature, how was it measured, and when did it start?     Denies 7. CARDIAC HISTORY: Do you have any history of heart disease? (e.g., heart attack, congestive heart failure)      HTN, per chart 8. LUNG HISTORY: Do you have any history of lung disease?  (e.g., pulmonary embolus, asthma, emphysema)     Denies 10. OTHER SYMPTOMS: Do you have any other symptoms? (e.g., runny nose, wheezing, chest pain)     Denies chest pain, denies wheezing, denies additional cold/flu symptoms     Patient is scheduled for NP appointment on Tuesday of next week. This RN advised patient to care for cough at home for now and address concerns at NP appointment. This RN advised patient to go to UC over the weekend  if symptoms worsen. Patient verbalized understanding.   Reason for Disposition  Cough  Protocols used: Cough - Acute Productive-A-AH  Copied from CRM #[id number]. Topic: Clinical - Red Word Triage >> Jan 14, 2024 10:32 AM Amy B wrote: Red Word that prompted transfer to Nurse Triage: Nausea, severe cough

## 2024-01-18 ENCOUNTER — Encounter: Payer: Self-pay | Admitting: Family Medicine

## 2024-01-18 ENCOUNTER — Ambulatory Visit: Admitting: Family Medicine

## 2024-01-18 VITALS — BP 134/62 | HR 61 | Temp 98.7°F | Resp 18 | Ht 71.0 in | Wt 167.1 lb

## 2024-01-18 DIAGNOSIS — R7309 Other abnormal glucose: Secondary | ICD-10-CM

## 2024-01-18 DIAGNOSIS — I152 Hypertension secondary to endocrine disorders: Secondary | ICD-10-CM

## 2024-01-18 DIAGNOSIS — R109 Unspecified abdominal pain: Secondary | ICD-10-CM | POA: Diagnosis not present

## 2024-01-18 DIAGNOSIS — R052 Subacute cough: Secondary | ICD-10-CM | POA: Insufficient documentation

## 2024-01-18 DIAGNOSIS — E1159 Type 2 diabetes mellitus with other circulatory complications: Secondary | ICD-10-CM

## 2024-01-18 DIAGNOSIS — Z87898 Personal history of other specified conditions: Secondary | ICD-10-CM | POA: Diagnosis not present

## 2024-01-18 DIAGNOSIS — Z7984 Long term (current) use of oral hypoglycemic drugs: Secondary | ICD-10-CM | POA: Diagnosis not present

## 2024-01-18 LAB — POCT GLYCOSYLATED HEMOGLOBIN (HGB A1C): Hemoglobin A1C: 7.6 % — AB (ref 4.0–5.6)

## 2024-01-18 MED ORDER — METFORMIN HCL 500 MG PO TABS: 500.0000 mg | ORAL_TABLET | Freq: Two times a day (BID) | ORAL | 3 refills | Status: AC

## 2024-01-18 NOTE — Assessment & Plan Note (Signed)
 Acute Cough Persistent post-flu cough improved with Tessalon Perles. Lung sounds clear on auscultation - Order chest x-ray if symptoms worsen.

## 2024-01-18 NOTE — Progress Notes (Signed)
 "  New Patient Office Visit  Subjective    Patient ID: Ryan Garrison, male    DOB: 1930-08-05  Age: 89 y.o. MRN: [id number]  CC:  Chief Complaint  Patient presents with   Establish Care   Cough    Discussed the use of AI scribe software for clinical note transcription with the patient, who gave verbal consent to proceed.  History of Present Illness   Ryan Garrison is a 89 year old male who presents for reestablishment of care and evaluation of a persistent cough following a recent flu infection.  Cough and respiratory symptoms - Persistent mild cough ongoing for approximately three and a half weeks following influenza infection - Cough has improved with Tessalon Perles - No shortness of breath - No wheezing - Breathing is good  Recent influenza infection - Influenza infection occurred approximately three and a half weeks ago - Symptoms included fever, chills, and persistent cough - Received IV fluids and chest x-ray in the emergency room - Influenza symptoms have resolved except for mild cough  Glycemic status - Concern about blood sugar levels - Told he was borderline diabetic approximately 35 years ago - No formal diagnosis of diabetes - Requests blood sugar check during visit - No laboratory work since 2022 - Previously checked blood sugar years ago but has since stopped  Hypertension - Currently taking a half pill of blood pressure medication daily (atenolol -chlorthalidone  atenolol -chlorthalidone  (TENORETIC ) 50-25 MG tablet) - Denies chest pain or shortness of breath  Functional status and general health - Resides in [place] for 60 years - Generally active - Desires longevity, aiming to live to 89 years old       Outpatient Encounter Medications as of 01/18/2024  Medication Sig   acetaminophen (TYLENOL) 500 MG tablet Take 500 mg by mouth 2 (two) times daily.   allopurinol (ZYLOPRIM) 300 MG tablet TAKE 1 TABLET EVERY DAY (Patient taking differently: Take  300 mg by mouth daily.)   ALPRAZolam  (XANAX ) 0.25 MG tablet Take 1 tablet (0.25 mg total) by mouth 2 (two) times daily as needed for anxiety.   atenolol -chlorthalidone  (TENORETIC ) 50-25 MG tablet TAKE 1/2 TABLET BY MOUTH DAILY (Patient taking differently: Take 0.5 tablets by mouth daily.)   benzonatate (TESSALON) 100 MG capsule Take 200 mg by mouth 3 (three) times daily as needed for cough. (Patient taking differently: Take 200 mg by mouth 3 (three) times daily as needed for cough. Patient states he takes one in AM, one around lunch time and one in the evening.)   metFORMIN  (GLUCOPHAGE ) 500 MG tablet Take 1 tablet (500 mg total) by mouth 2 (two) times daily with a meal.   omeprazole  (PRILOSEC) 40 MG capsule Take 1 capsule (40 mg total) by mouth daily. (Patient taking differently: Take 40 mg by mouth daily. PRN)   potassium chloride SA (KLOR-CON) 20 MEQ tablet TAKE 3 TABLETS DAILY ON MONDAYS, WEDNESDAYS, AND FRIDAYS AND TAKE 2 TABLETS DAILY ON ALL OTHER DAYS. (Patient taking differently: Take 40 mEq by mouth daily.)   aspirin 81 MG chewable tablet Chew 81 mg by mouth daily. (Patient not taking: Reported on 01/18/2024)   finasteride  (PROSCAR ) 5 MG tablet Take 1 tablet (5 mg total) by mouth daily. (Patient not taking: Reported on 01/18/2024)   [DISCONTINUED] escitalopram  (LEXAPRO ) 5 MG tablet TAKE 1 TABLET(5 MG) BY MOUTH DAILY (Patient not taking: Reported on 01/18/2024)   No facility-administered encounter medications on file as of 01/18/2024.    Past Medical History:  Diagnosis Date  Gout    Hearing loss    Hypertension    Renal stones     Past Surgical History:  Procedure Laterality Date   CHOLECYSTECTOMY     HERNIA REPAIR Right    Inguinal    Family History  Problem Relation Age of Onset   Leukemia Father     Social History   Socioeconomic History   Marital status: Widowed    Spouse name: Gracie   Number of children: 3   Years of education: Not on file   Highest education  level: Not on file  Occupational History   Occupation: Retired  Tobacco Use   Smoking status: Former    Current packs/day: 0.00    Types: Cigarettes    Quit date: 1954    Years since quitting: 72.0   Smokeless tobacco: Never  Vaping Use   Vaping status: Never Used  Substance and Sexual Activity   Alcohol use: Never   Drug use: Never   Sexual activity: Not Currently  Other Topics Concern   Not on file  Social History Narrative   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   4 Brothers, 1 deceased   6 Sisters, 2 deceased   Social Drivers of Health   Tobacco Use: Medium Risk (01/18/2024)   Patient History    Smoking Tobacco Use: Former    Smokeless Tobacco Use: Never    Passive Exposure: Not on Actuary Strain: Not on file  Food Insecurity: Not on file  Transportation Needs: Not on file  Physical Activity: Not on file  Stress: Not on file  Social Connections: Not on file  Intimate Partner Violence: Not on file  Depression (PHQ2-9): Not on file  Alcohol Screen: Not on file  Housing: Not on file  Utilities: Not on file  Health Literacy: Not on file    Review of Systems  Constitutional:  Positive for chills and fever. Negative for malaise/fatigue.  HENT:  Negative for congestion, ear pain, sinus pain and sore throat.   Eyes: Negative.   Respiratory:  Positive for cough and shortness of breath. Negative for wheezing.   Cardiovascular:  Negative for chest pain, palpitations and leg swelling.  Gastrointestinal:  Negative for constipation, diarrhea, nausea and vomiting.  Genitourinary:  Negative for dysuria, frequency and urgency.  Musculoskeletal: Negative.   Skin: Negative.   Neurological:  Negative for dizziness and headaches.  Endo/Heme/Allergies: Negative.   Psychiatric/Behavioral: Negative.          Objective    BP 134/62   Pulse 61   Temp 98.7 F (37.1 C) (Temporal)   Resp 18   Ht 5' 11 (1.803 m)   Wt 167 lb 1.6 oz (75.8 kg)   SpO2 98%   BMI 23.31 kg/m    Physical Exam Vitals reviewed.  Constitutional:      General: He is not in acute distress.    Appearance: Normal appearance. He is not ill-appearing.  HENT:     Right Ear: Decreased hearing noted.     Left Ear: Decreased hearing noted.     Nose: Congestion present.  Eyes:     Conjunctiva/sclera: Conjunctivae normal.  Cardiovascular:     Rate and Rhythm: Normal rate and regular rhythm.     Heart sounds: Normal heart sounds. No murmur heard. Pulmonary:     Effort: Pulmonary effort is normal. No respiratory distress.     Breath sounds: Normal breath sounds. No wheezing or rhonchi.  Skin:    General: Skin is warm.  Neurological:     Mental Status: He is alert and oriented to person, place, and time. Mental status is at baseline.  Psychiatric:        Mood and Affect: Mood normal.        Behavior: Behavior normal.         Assessment & Plan:   Problem List Items Addressed This Visit       Cardiovascular and Mediastinum   Hypertension associated with diabetes (HCC) - Primary   Hypertension Chronic, well controlled - Continue current medication management BP Readings from Last 3 Encounters:  01/18/24 134/62  07/02/20 120/68  06/29/20 129/70   Diabetes Mellitus (new diagnosis) A1c 7.6 confirms diabetes. Prescribed metformin  for glucose management. - Prescribe metformin  once daily for the first week, then increase to twice daily. - Send prescription to [facility] in [place]. - Schedule follow-up in two months for A1c recheck and full lab workup.          Relevant Medications   metFORMIN  (GLUCOPHAGE ) 500 MG tablet     Endocrine   History of borderline diabetes mellitus   Relevant Orders   POCT glycosylated hemoglobin (Hb A1C) (Completed)     Other   Subacute cough   Acute Cough Persistent post-flu cough improved with Tessalon Perles. Lung sounds clear on auscultation - Order chest x-ray if symptoms worsen.      Relevant Orders   DG Chest 2 View     General Health Maintenance No full lab workup since 2022. Fingerstick blood glucose performed. - Schedule full lab workup including cholesterol, liver and kidney function, and blood count at follow-up appointment.       Follow-up: Return in about 2 months (around 03/17/2024).    Harrie Cedar, FNP [facility] [phone]    "

## 2024-01-18 NOTE — Patient Instructions (Signed)
 Med [facility] [address], [place], [place] [address] [phone]  IMAGING 8-5 PM MONDAY THROUGH FRIDAY.

## 2024-01-18 NOTE — Progress Notes (Signed)
 est

## 2024-01-18 NOTE — Assessment & Plan Note (Addendum)
 Hypertension Chronic, well controlled - Continue current medication management BP Readings from Last 3 Encounters:  01/18/24 134/62  07/02/20 120/68  06/29/20 129/70   Diabetes Mellitus (new diagnosis) A1c 7.6 confirms diabetes. Prescribed metformin  for glucose management. - Prescribe metformin  once daily for the first week, then increase to twice daily. - Send prescription to [facility] in [place]. - Schedule follow-up in two months for A1c recheck and full lab workup.

## 2024-01-19 ENCOUNTER — Telehealth: Payer: Self-pay

## 2024-01-19 ENCOUNTER — Other Ambulatory Visit: Payer: Self-pay | Admitting: Family Medicine

## 2024-01-19 DIAGNOSIS — E1159 Type 2 diabetes mellitus with other circulatory complications: Secondary | ICD-10-CM

## 2024-01-19 MED ORDER — LANCET DEVICE MISC: 1.0000 | 0 refills | Status: AC

## 2024-01-19 MED ORDER — LANCETS MISC: 1.0000 | 0 refills | Status: AC

## 2024-01-19 MED ORDER — BLOOD GLUCOSE MONITORING SUPPL DEVI: 1.0000 | 0 refills | Status: AC

## 2024-01-19 MED ORDER — BLOOD GLUCOSE TEST VI STRP: 1.0000 | ORAL_STRIP | 0 refills | Status: AC

## 2024-01-19 NOTE — Telephone Encounter (Signed)
 Copied from CRM [id number]. Topic: Clinical - Medical Advice >> Jan 19, 2024  9:44 AM Ryan Garrison wrote: Wants to know how to check his blood sugar he is a diabetic. He doesn't have anything to check it with. He doesn't have a monitor or strips.

## 2024-01-19 NOTE — Progress Notes (Unsigned)
 gu

## 2024-03-21 ENCOUNTER — Ambulatory Visit: Admitting: Family Medicine
